# Patient Record
Sex: Female | Born: 1970 | Race: White | Hispanic: No | Marital: Single | Smoking: Never smoker
Health system: Southern US, Community
[De-identification: ages and names within clinical notes are randomized; demographics above are authoritative.]

## PROBLEM LIST (undated history)

## (undated) DIAGNOSIS — F329 Major depressive disorder, single episode, unspecified: Secondary | ICD-10-CM

## (undated) DIAGNOSIS — F419 Anxiety disorder, unspecified: Secondary | ICD-10-CM

## (undated) DIAGNOSIS — F32A Depression, unspecified: Secondary | ICD-10-CM

---

## 1998-04-19 ENCOUNTER — Emergency Department (HOSPITAL_COMMUNITY): Admission: EM | Admit: 1998-04-19 | Discharge: 1998-04-19 | Payer: Self-pay | Admitting: Emergency Medicine

## 1999-09-24 ENCOUNTER — Other Ambulatory Visit: Admission: RE | Admit: 1999-09-24 | Discharge: 1999-09-24 | Payer: Self-pay | Admitting: *Deleted

## 1999-09-24 ENCOUNTER — Encounter (INDEPENDENT_AMBULATORY_CARE_PROVIDER_SITE_OTHER): Payer: Self-pay | Admitting: Specialist

## 2001-05-05 ENCOUNTER — Encounter: Payer: Self-pay | Admitting: Emergency Medicine

## 2001-05-05 ENCOUNTER — Emergency Department (HOSPITAL_COMMUNITY): Admission: EM | Admit: 2001-05-05 | Discharge: 2001-05-05 | Payer: Self-pay | Admitting: *Deleted

## 2001-07-11 ENCOUNTER — Encounter: Payer: Self-pay | Admitting: Emergency Medicine

## 2001-07-11 ENCOUNTER — Inpatient Hospital Stay (HOSPITAL_COMMUNITY): Admission: EM | Admit: 2001-07-11 | Discharge: 2001-07-12 | Payer: Self-pay | Admitting: Emergency Medicine

## 2002-09-29 ENCOUNTER — Emergency Department (HOSPITAL_COMMUNITY): Admission: EM | Admit: 2002-09-29 | Discharge: 2002-09-29 | Payer: Self-pay | Admitting: Emergency Medicine

## 2003-01-24 ENCOUNTER — Emergency Department (HOSPITAL_COMMUNITY): Admission: EM | Admit: 2003-01-24 | Discharge: 2003-01-24 | Payer: Self-pay | Admitting: Emergency Medicine

## 2006-02-07 ENCOUNTER — Emergency Department (HOSPITAL_COMMUNITY): Admission: EM | Admit: 2006-02-07 | Discharge: 2006-02-07 | Payer: Self-pay | Admitting: Emergency Medicine

## 2007-04-02 ENCOUNTER — Emergency Department (HOSPITAL_COMMUNITY): Admission: EM | Admit: 2007-04-02 | Discharge: 2007-04-03 | Payer: Self-pay | Admitting: Emergency Medicine
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
## 2007-12-04 ENCOUNTER — Emergency Department (HOSPITAL_COMMUNITY): Admission: EM | Admit: 2007-12-04 | Discharge: 2007-12-04 | Payer: Self-pay | Admitting: Emergency Medicine

## 2008-03-08 ENCOUNTER — Inpatient Hospital Stay (HOSPITAL_COMMUNITY): Admission: AD | Admit: 2008-03-08 | Discharge: 2008-03-09 | Payer: Self-pay | Admitting: Obstetrics & Gynecology

## 2008-03-08 ENCOUNTER — Other Ambulatory Visit: Payer: Self-pay | Admitting: Family Medicine

## 2008-06-22 ENCOUNTER — Inpatient Hospital Stay (HOSPITAL_COMMUNITY): Admission: AD | Admit: 2008-06-22 | Discharge: 2008-06-22 | Payer: Self-pay | Admitting: Obstetrics & Gynecology

## 2008-06-22 ENCOUNTER — Ambulatory Visit: Payer: Self-pay | Admitting: Obstetrics and Gynecology

## 2008-06-22 ENCOUNTER — Encounter: Payer: Self-pay | Admitting: Obstetrics and Gynecology

## 2008-06-29 ENCOUNTER — Ambulatory Visit: Payer: Self-pay | Admitting: *Deleted

## 2008-07-20 ENCOUNTER — Ambulatory Visit: Payer: Self-pay | Admitting: Obstetrics & Gynecology

## 2008-08-10 ENCOUNTER — Ambulatory Visit: Payer: Self-pay | Admitting: Obstetrics & Gynecology

## 2008-08-24 ENCOUNTER — Ambulatory Visit: Payer: Self-pay | Admitting: Obstetrics & Gynecology

## 2008-08-31 ENCOUNTER — Ambulatory Visit: Payer: Self-pay | Admitting: Obstetrics & Gynecology

## 2008-09-07 ENCOUNTER — Ambulatory Visit: Payer: Self-pay | Admitting: Obstetrics & Gynecology

## 2008-09-14 ENCOUNTER — Ambulatory Visit: Payer: Self-pay | Admitting: Obstetrics & Gynecology

## 2008-09-21 ENCOUNTER — Ambulatory Visit: Payer: Self-pay | Admitting: Obstetrics & Gynecology

## 2008-09-21 ENCOUNTER — Ambulatory Visit (HOSPITAL_COMMUNITY): Admission: RE | Admit: 2008-09-21 | Discharge: 2008-09-21 | Payer: Self-pay | Admitting: Family Medicine

## 2008-09-23 ENCOUNTER — Inpatient Hospital Stay (HOSPITAL_COMMUNITY): Admission: RE | Admit: 2008-09-23 | Discharge: 2008-09-26 | Payer: Self-pay | Admitting: Obstetrics & Gynecology

## 2008-09-23 ENCOUNTER — Ambulatory Visit: Payer: Self-pay | Admitting: Physician Assistant

## 2009-10-19 IMAGING — US US OB COMP LESS 14 WK
1 series · 14 of 27 positions shown · non-contrast
Comparison: None.

CLINICAL DATA: Unknown accurate LMP, approximately 01/02/2008 (9
weeks 3 days), presenting with 2-week history of spotting.

OBSTETRIC <14 WK ULTRASOUND 03/08/2008:
TECHNIQUE: Transabdominal ultrasound was performed for evaluation
of the gestation as well as the maternal uterus and adnexal
regions.

[Series 1: us ob comp less 14 wks · 14 of 27 slices shown]
[im 1/27]
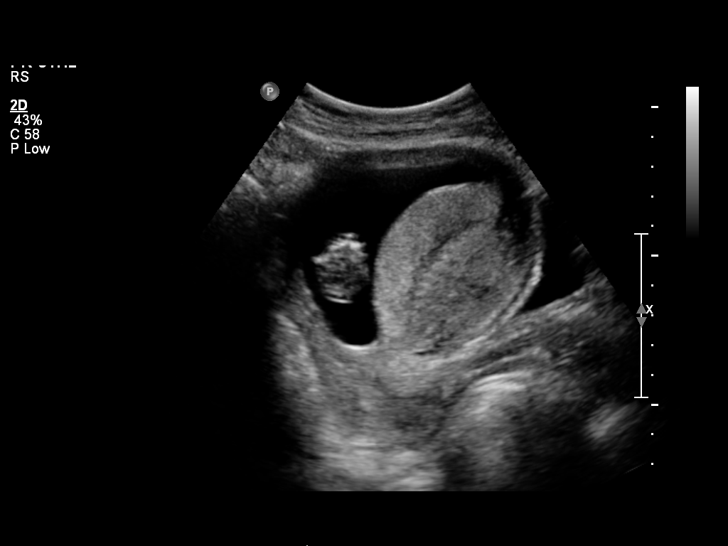
[im 3/27]
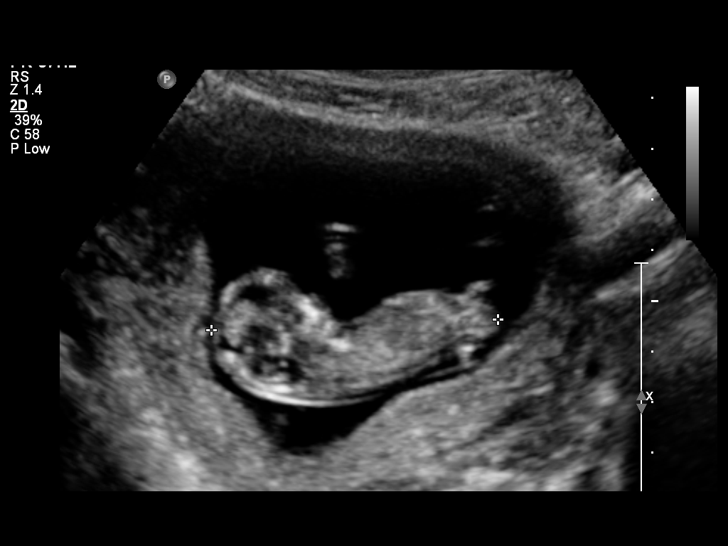
[im 5/27]
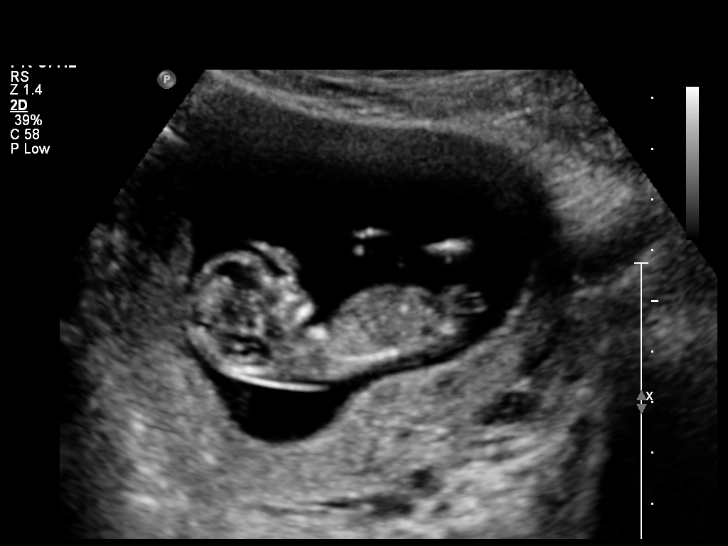
[im 7/27]
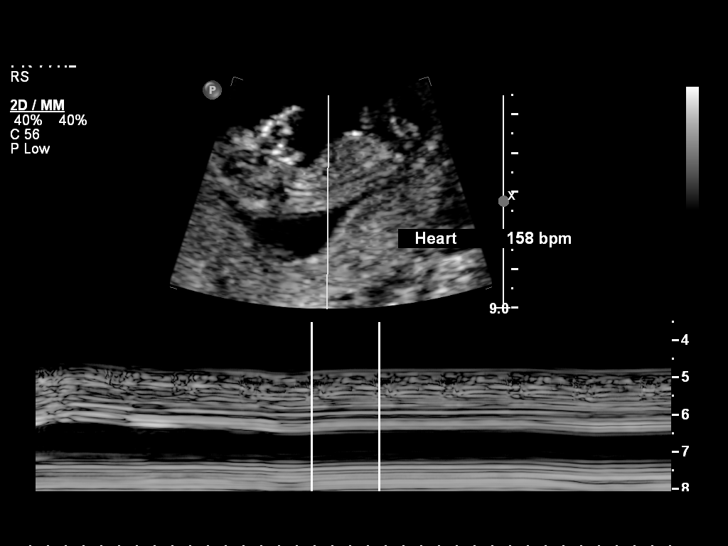
[im 9/27]
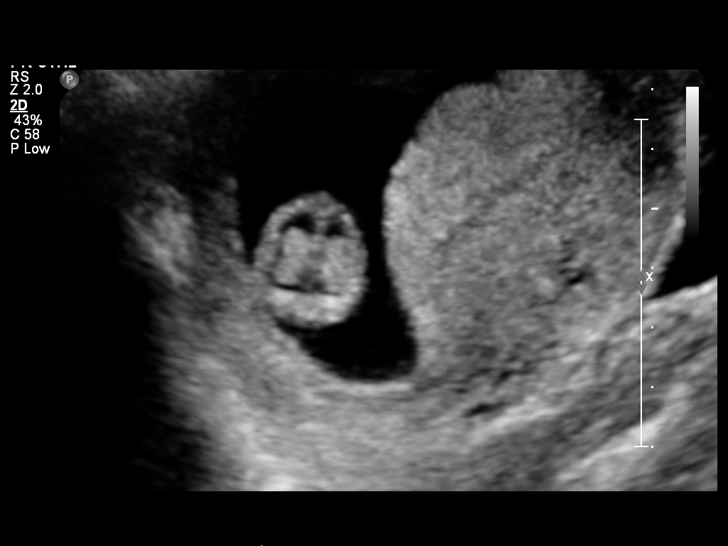
[im 11/27]
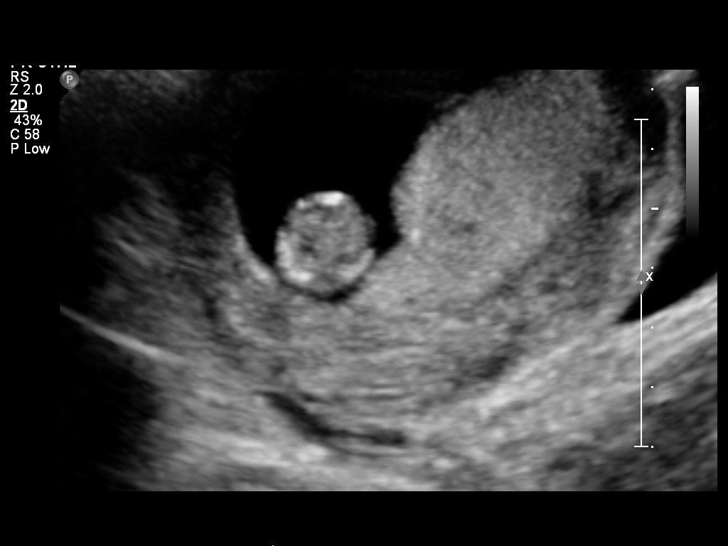
[im 13/27]
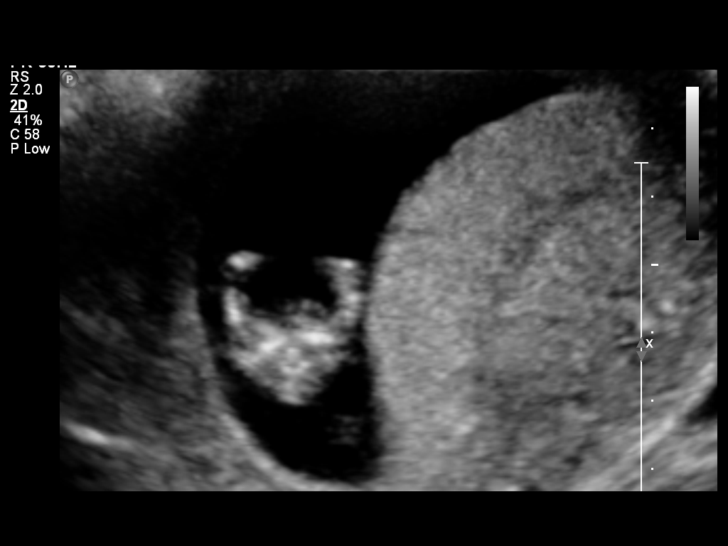
[im 15/27]
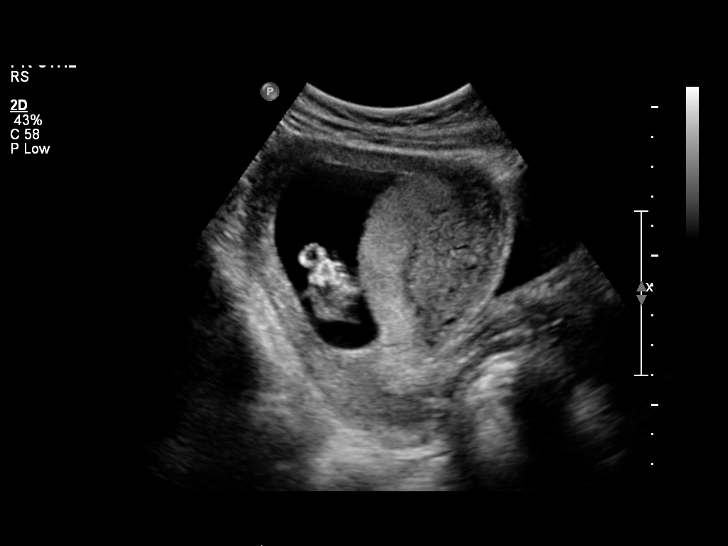
[im 17/27]
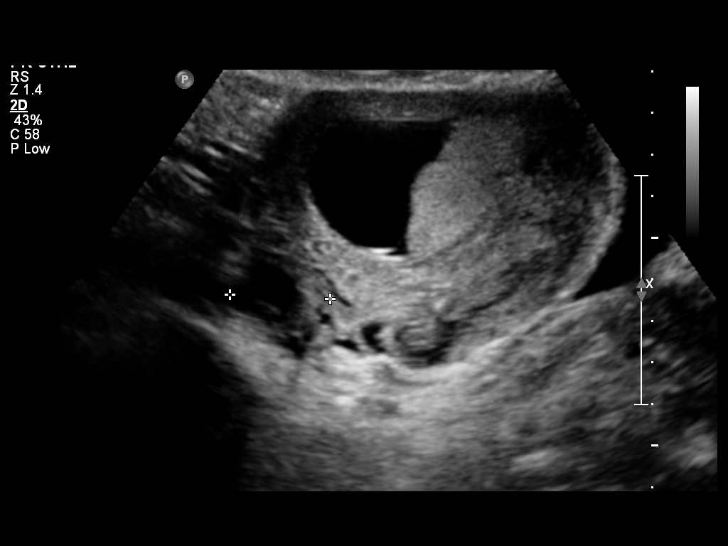
[im 19/27]
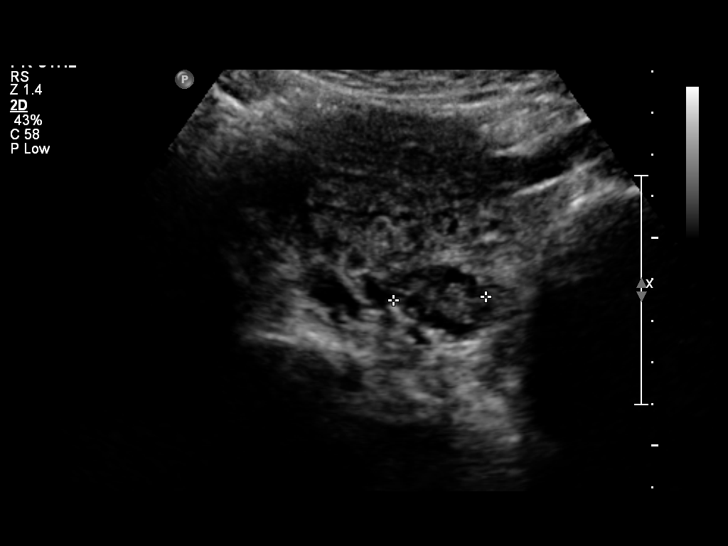
[im 21/27]
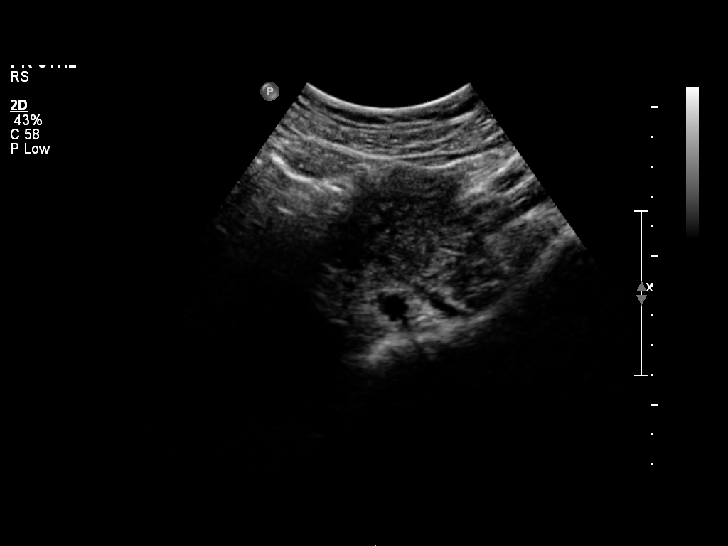
[im 23/27]
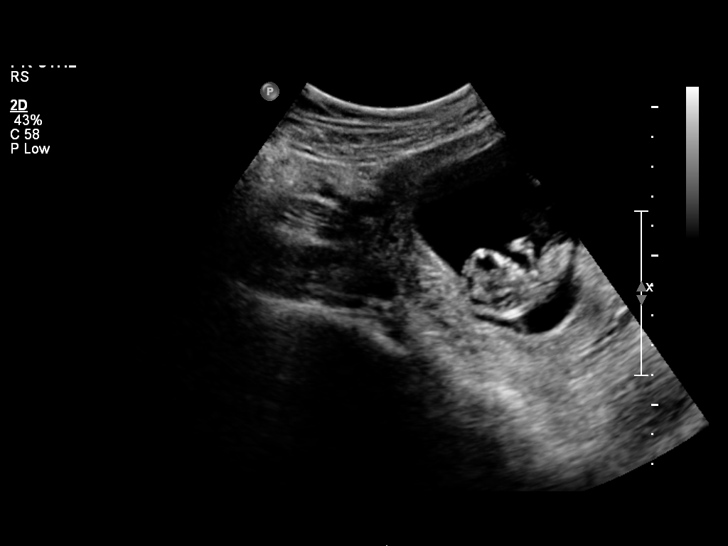
[im 25/27]
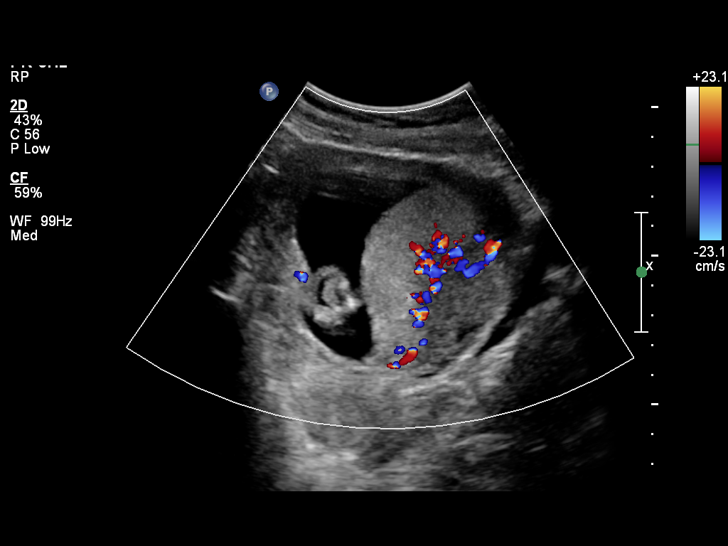
[im 27/27]
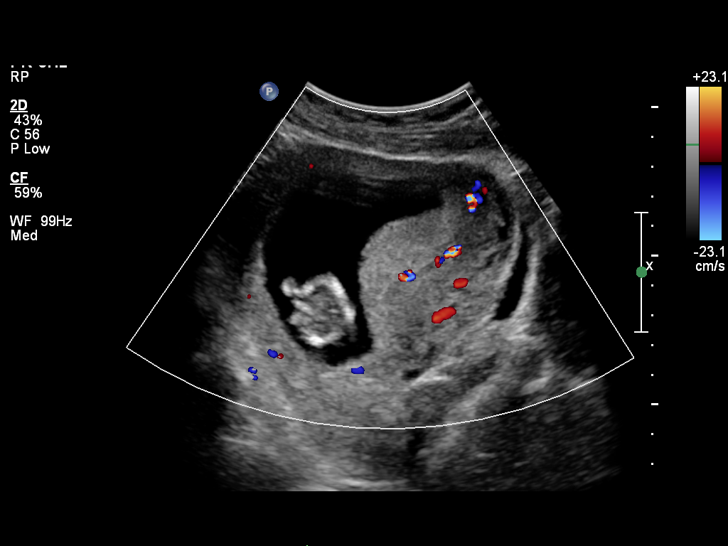

[14 of 27 positions shown; findings below may reference images not displayed]

Intrauterine gestational sac: Single.
Yolk sac: Visible.
Embryo: Visible.
Cardiac Activity: Visible.
Heart Rate: 158 bpm

CRL:  58 mm         12w  3d         US EDC: 09/17/2008

Maternal uterus/adnexae:
No evidence of subchorionic hemorrhage.  Both ovaries visualized
and both normal in appearance.  No adnexal masses or free fluid.
IMPRESSION: 1.  Single living intrauterine fetus with estimated gestational age
12 weeks 3 days by crown-rump length.  Ultrasound EDC 09/17/2008.
2.  No subchorionic hemorrhage.
3.  Normal ovaries.  No adnexal masses or free fluid.

## 2010-05-04 IMAGING — US US FETAL BPP W/O NONSTRESS
1 series · 14 of 15 positions shown · non-contrast
Comparison: none

OBSTETRICAL ULTRASOUND:
 This ultrasound exam was performed in the [HOSPITAL] Ultrasound Department.  The OB US report was generated in the AS system, and faxed to the ordering physician.  This report is also available in [REDACTED] PACS.

[Series 1: us fetal bpp w/o nonstress · 0.28mm/px · 15 acquisitions, 14 frames shown]
[im 1/15]
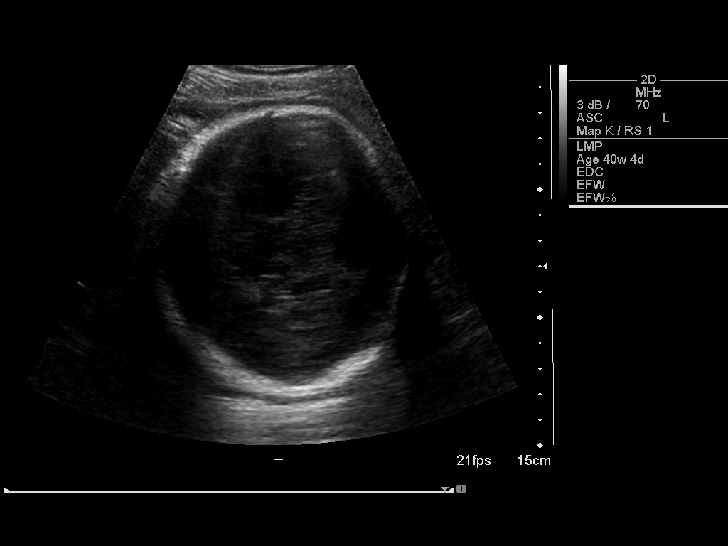
[im 2/15]
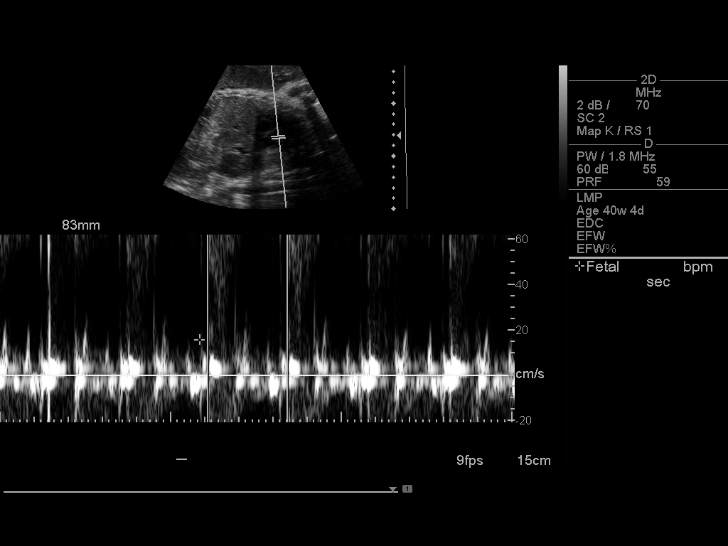
[im 3/15]
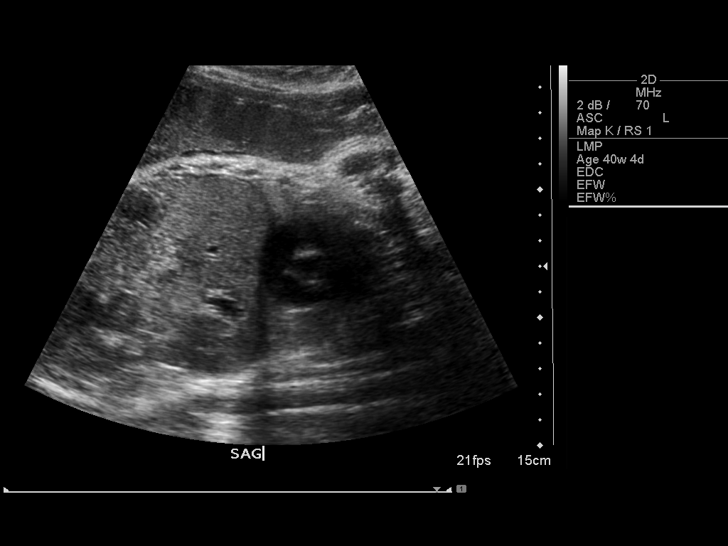
[im 4/15]
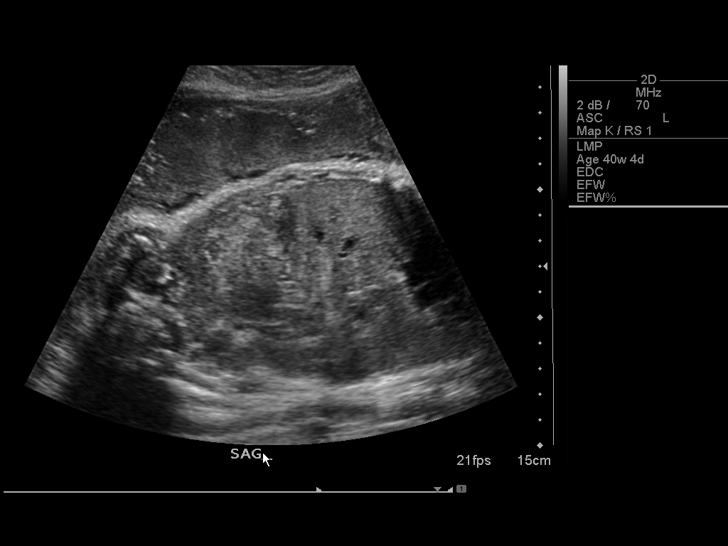
[im 5/15]
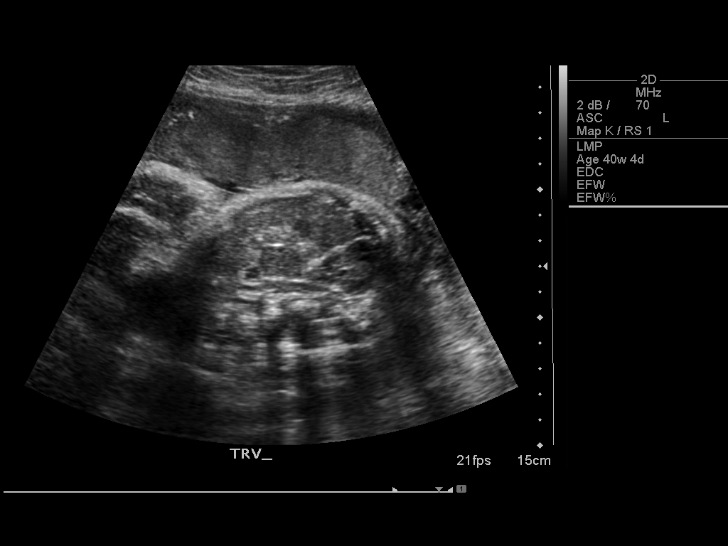
[im 6/15]
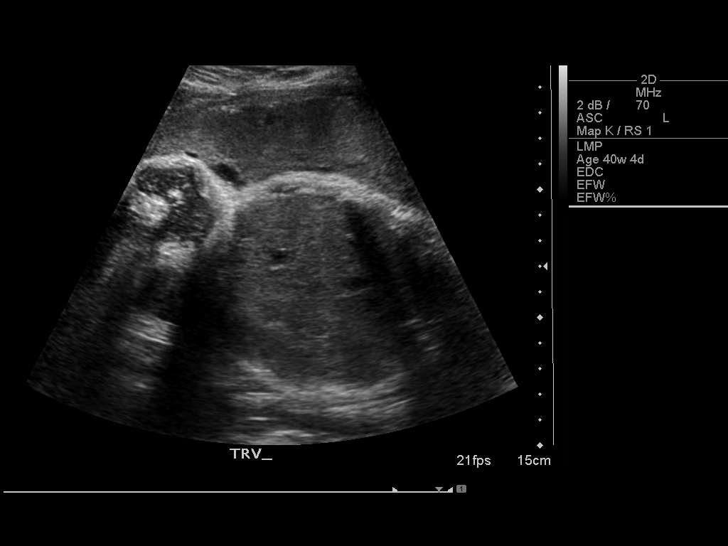
[im 7/15]
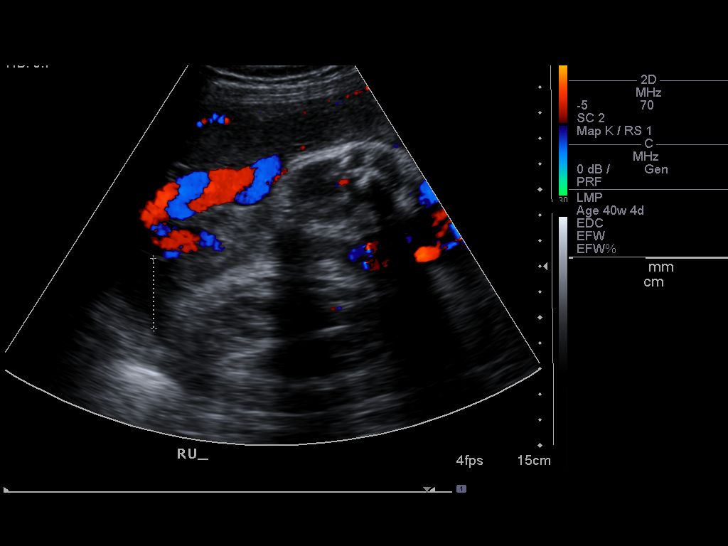
[im 9/15]
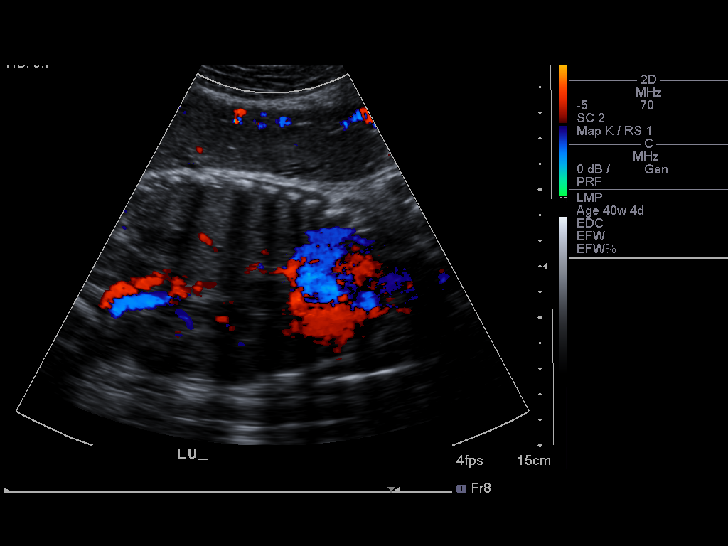
[im 10/15]
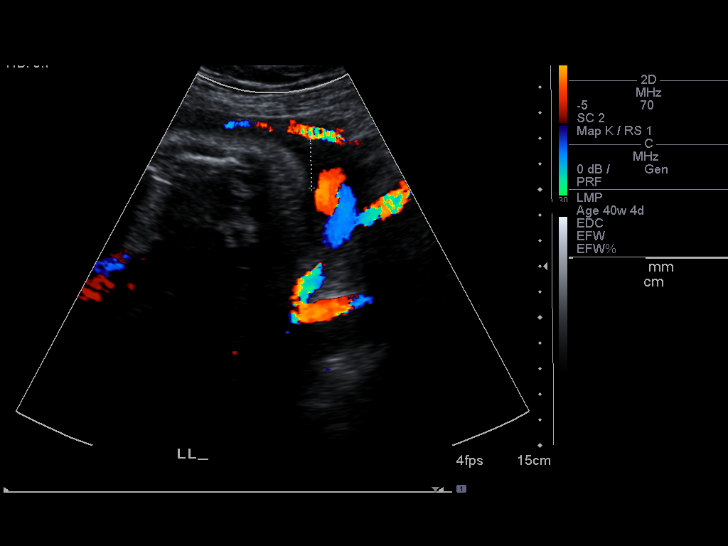
[im 11/15]
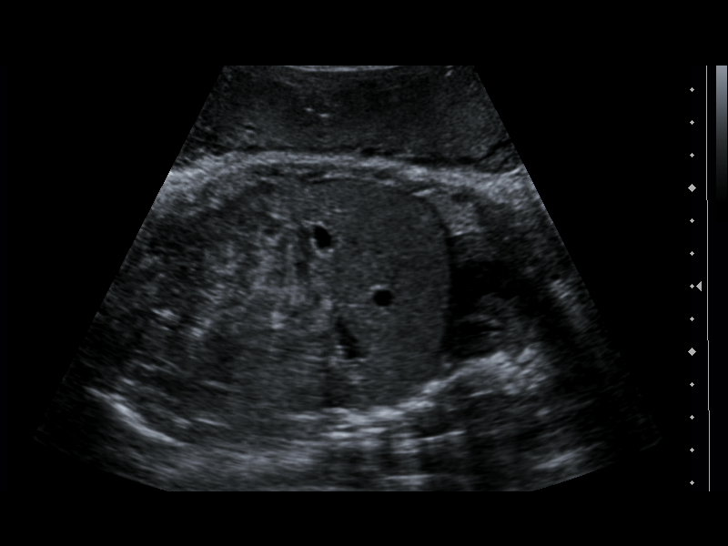
[im 12/15]
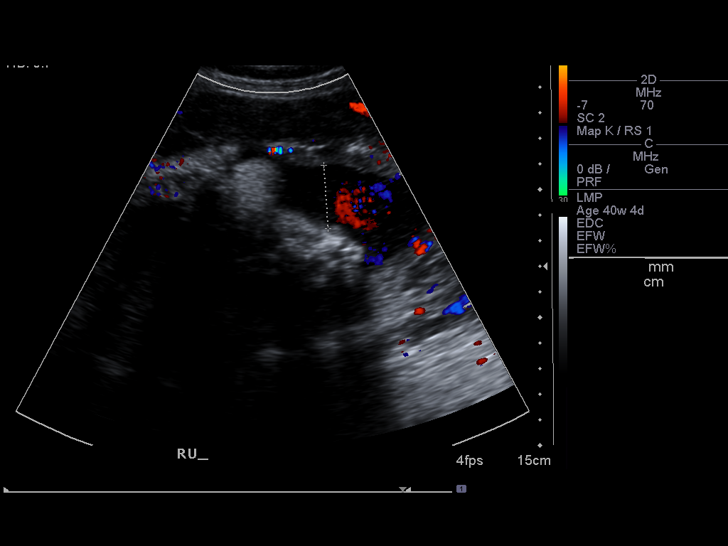
[im 13/15]
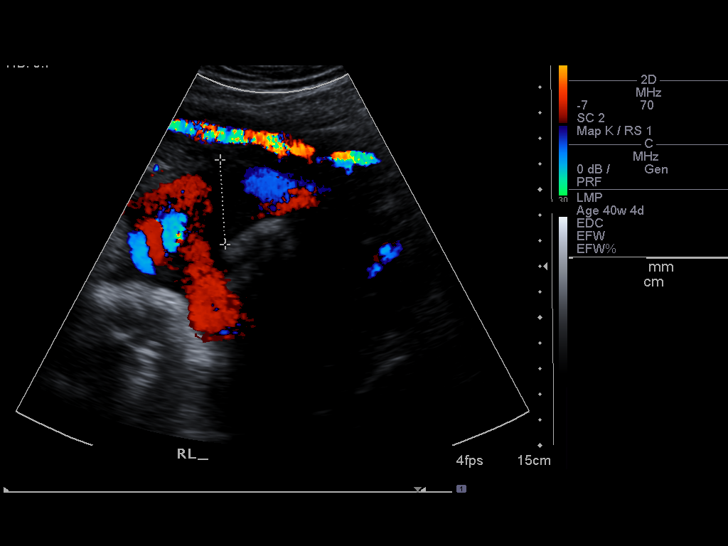
[im 14/15]
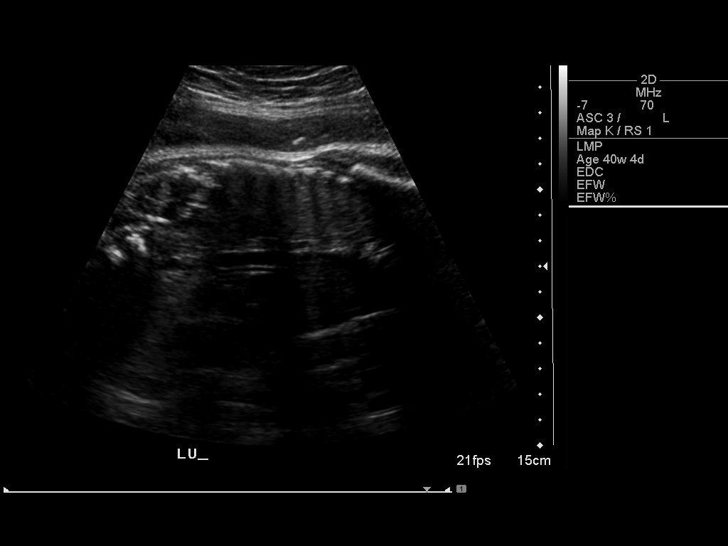
[im 15/15]
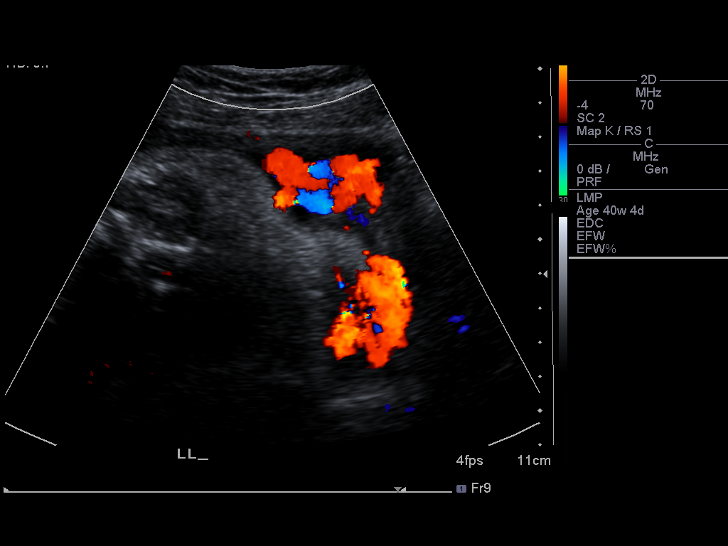

[14 of 15 positions shown; findings below may reference images not displayed]

IMPRESSION: See [facility] Obstetric US report.

## 2011-05-06 ENCOUNTER — Institutional Professional Consult (permissible substitution): Payer: Self-pay | Admitting: Medical

## 2011-07-11 ENCOUNTER — Encounter: Payer: Self-pay | Admitting: Family Medicine

## 2011-07-11 ENCOUNTER — Ambulatory Visit (INDEPENDENT_AMBULATORY_CARE_PROVIDER_SITE_OTHER): Payer: Medicaid Other | Admitting: Family Medicine

## 2011-07-11 VITALS — BP 114/70 | HR 85 | Wt 150.0 lb

## 2011-07-11 DIAGNOSIS — M549 Dorsalgia, unspecified: Secondary | ICD-10-CM

## 2011-07-11 MED ORDER — OXYCODONE-ACETAMINOPHEN 5-325 MG PO TABS: 1.0000 | ORAL_TABLET | Freq: Four times a day (QID) | ORAL | Status: AC | PRN

## 2011-07-11 NOTE — Progress Notes (Signed)
  Subjective:    Patient ID: Latoya Schaefer, female    DOB: [birth year], 40 y.o.   MRN: [id number]  HPI She is here for evaluation of back pain. She had a ruptured disc in 1999 from a work related injury and was seen by Dr. been. Apparently MRI scans did show 3 herniations. She was released by the orthopedic surgeon in 2000 and since then has had only minor troubles that she is him on her own. Approximately 7 days ago while doing nothing in particular she experienced back pain. She is having pain down both legs. She has tried Advil roughly 8 per day with no real relief. She says that Percocet helps her pain. Apparently the Vicodin is not successful.   Review of Systems     Objective:   Physical Exam Pain on motion of the back in all directions including rotational motion. Normal sensory and DTRs. She walks with a normal gait. Straight leg raising was questionably positive bilaterally at 60 but negative sciatic stretch.       Assessment & Plan:  Recurrence of back pain. I will give her Percocet. Discussed followup with her in regard to possible MRI and further interventions. At the end of the encounter she then asked for something for her anxiety. Review of her prescriptions through the state system shows that she is getting amphetamines and alprazolam on a regular basis. I did not tell her I do this but did tell her I would not get involved with the care for her

## 2011-07-15 ENCOUNTER — Telehealth: Payer: Self-pay

## 2011-07-15 ENCOUNTER — Encounter: Payer: Self-pay | Admitting: Family Medicine

## 2011-07-15 NOTE — Telephone Encounter (Signed)
Pt called and had slurring words she said med Dr.Lalonde gave her are not helping back pain is there any thing else he could give her

## 2011-07-15 NOTE — Telephone Encounter (Signed)
Called pt to let her know there is nothing stronger he could give her she said what should I do go to the ER I told her Im not for sure just theres nothing stronger he could give hrer

## 2011-07-15 NOTE — Telephone Encounter (Signed)
Left ear no that there  is nothing stronger than I can give her for her back.

## 2011-07-23 ENCOUNTER — Ambulatory Visit: Payer: Medicaid Other | Admitting: Family Medicine

## 2011-08-27 LAB — GC/CHLAMYDIA PROBE AMP, GENITAL: Chlamydia, DNA Probe: NEGATIVE

## 2011-08-27 LAB — WET PREP, GENITAL
Clue Cells Wet Prep HPF POC: NONE SEEN
Yeast Wet Prep HPF POC: NONE SEEN

## 2011-08-30 LAB — WET PREP, GENITAL
Clue Cells Wet Prep HPF POC: NONE SEEN
Trich, Wet Prep: NONE SEEN
Yeast Wet Prep HPF POC: NONE SEEN

## 2011-08-30 LAB — POCT URINALYSIS DIP (DEVICE)
Bilirubin Urine: NEGATIVE
Glucose, UA: NEGATIVE
Hgb urine dipstick: NEGATIVE
Ketones, ur: NEGATIVE
Operator id: 120861
Protein, ur: NEGATIVE
Urobilinogen, UA: 0.2

## 2011-08-30 LAB — FETAL FIBRONECTIN: Fetal Fibronectin: NEGATIVE

## 2011-08-30 LAB — RAPID URINE DRUG SCREEN, HOSP PERFORMED
Amphetamines: NOT DETECTED
Cocaine: NOT DETECTED

## 2011-08-30 LAB — HIV ANTIBODY (ROUTINE TESTING W REFLEX): HIV: NONREACTIVE

## 2011-09-02 LAB — POCT URINALYSIS DIP (DEVICE)
Bilirubin Urine: NEGATIVE
Bilirubin Urine: NEGATIVE
Bilirubin Urine: NEGATIVE
Glucose, UA: NEGATIVE
Hgb urine dipstick: NEGATIVE
Hgb urine dipstick: NEGATIVE
Ketones, ur: NEGATIVE
Ketones, ur: NEGATIVE
Nitrite: NEGATIVE
Nitrite: NEGATIVE
Nitrite: NEGATIVE
Operator id: 297281
Operator id: 194561
Operator id: 287931
Protein, ur: 30 — AB
Protein, ur: 30 — AB
Protein, ur: NEGATIVE
Specific Gravity, Urine: 1.015
Specific Gravity, Urine: 1.025
Specific Gravity, Urine: 1.025
Urobilinogen, UA: 0.2
Urobilinogen, UA: 1
Urobilinogen, UA: 1
pH: 6.5
pH: 5.5
pH: 6
pH: 6

## 2011-09-02 LAB — CBC
HCT: 41.4
MCHC: 33.5
MCV: 89.1
MCV: 90.6
Platelets: 146 — ABNORMAL LOW
RBC: 4.64
RDW: 13.4
RDW: 13.7

## 2011-09-02 LAB — RPR: RPR Ser Ql: NONREACTIVE

## 2011-09-04 LAB — POCT URINALYSIS DIP (DEVICE)
Hgb urine dipstick: NEGATIVE
Ketones, ur: NEGATIVE
Nitrite: NEGATIVE
Protein, ur: NEGATIVE
pH: 5.5

## 2012-01-30 ENCOUNTER — Other Ambulatory Visit: Payer: Self-pay | Admitting: Neurosurgery

## 2012-01-30 DIAGNOSIS — M545 Low back pain: Secondary | ICD-10-CM

## 2012-02-04 ENCOUNTER — Inpatient Hospital Stay: Admission: RE | Admit: 2012-02-04 | Payer: Medicaid Other | Source: Ambulatory Visit

## 2012-02-22 ENCOUNTER — Inpatient Hospital Stay: Admission: RE | Admit: 2012-02-22 | Payer: Medicaid Other | Source: Ambulatory Visit

## 2012-02-26 ENCOUNTER — Inpatient Hospital Stay: Admission: RE | Admit: 2012-02-26 | Payer: Medicaid Other | Source: Ambulatory Visit

## 2012-03-06 ENCOUNTER — Inpatient Hospital Stay: Admission: RE | Admit: 2012-03-06 | Payer: Medicaid Other | Source: Ambulatory Visit

## 2016-03-15 ENCOUNTER — Emergency Department (HOSPITAL_COMMUNITY)
Admission: EM | Admit: 2016-03-15 | Discharge: 2016-03-16 | Disposition: A | Discharge status: Home / Self Care | Payer: Medicaid Other | Attending: Emergency Medicine | Admitting: Emergency Medicine

## 2016-03-15 ENCOUNTER — Encounter (HOSPITAL_COMMUNITY): Payer: Self-pay | Admitting: Emergency Medicine

## 2016-03-15 DIAGNOSIS — K029 Dental caries, unspecified: Secondary | ICD-10-CM | POA: Insufficient documentation

## 2016-03-15 DIAGNOSIS — K047 Periapical abscess without sinus: Secondary | ICD-10-CM | POA: Diagnosis not present

## 2016-03-15 DIAGNOSIS — Z8659 Personal history of other mental and behavioral disorders: Secondary | ICD-10-CM | POA: Diagnosis not present

## 2016-03-15 DIAGNOSIS — K0889 Other specified disorders of teeth and supporting structures: Secondary | ICD-10-CM

## 2016-03-15 HISTORY — DX: Major depressive disorder, single episode, unspecified: F32.9

## 2016-03-15 HISTORY — DX: Anxiety disorder, unspecified: F41.9

## 2016-03-15 HISTORY — DX: Depression, unspecified: F32.A

## 2016-03-15 MED ORDER — IBUPROFEN 800 MG PO TABS: 800.0000 mg | ORAL_TABLET | Freq: Three times a day (TID) | ORAL | Status: DC

## 2016-03-15 MED ORDER — CLINDAMYCIN HCL 150 MG PO CAPS: 450.0000 mg | ORAL_CAPSULE | Freq: Three times a day (TID) | ORAL | Status: DC

## 2016-03-15 NOTE — ED Notes (Signed)
Pt. reports left lower molar pain with swelling onset last month .

## 2016-03-15 NOTE — ED Provider Notes (Signed)
CSN: [id number]     Arrival date & time 03/15/16  2255 History  By signing my name below, I, Placido Sou, attest that this documentation has been prepared under the direction and in the presence of [employer], PA-C. Electronically Signed: Placido Sou, ED Scribe. 03/15/2016. 11:52 PM.   Chief Complaint  Patient presents with  . Dental Pain   The history is provided by the patient. No language interpreter was used.   HPI Comments: Latoya Schaefer is a 45 y.o. female with a PMHx of dental pain who presents to the Emergency Department complaining of waxing and waning, moderate, left lower dental pain onset 1 month ago. She states that she cracked a tooth in the infected region 1 month ago prior to the onset of her symptoms. Pt reports worsening swelling in the affected region. Her pain worsens with palpation. She reports having taken ~8 clindamycin from a prior rx without significant relief of her symptoms. She denies a PMHx of DM. Pt confirms having a dental provider. Pt denies difficulty swallowing, vomiting, fevers, chills, neck stiffness, or any other associated symptoms at this time.    Past Medical History  Diagnosis Date  . Depression   . Anxiety    History reviewed. No pertinent past surgical history. No family history on file. Social History  Substance Use Topics  . Smoking status: Never Smoker   . Smokeless tobacco: Never Used  . Alcohol Use: No   OB History    No data available     Review of Systems  Constitutional: Negative for fever and chills.  HENT: Positive for dental problem and facial swelling. Negative for trouble swallowing.   Gastrointestinal: Positive for nausea. Negative for vomiting.    Allergies  Review of patient's allergies indicates no known allergies.  Home Medications   Prior to Admission medications   Not on File   BP 115/71 mmHg  Pulse 96  Temp(Src) 97.3 F (36.3 C) (Oral)  Resp 16  Ht  (1.727 m)  Wt 145 lb (65.772 kg)  BMI  22.05 kg/m2  SpO2 100%  LMP 02/25/2016    Physical Exam  Constitutional: She is oriented to person, place, and time. She appears well-developed and well-nourished.  HENT:  Head: Normocephalic and atraumatic.  Mouth/Throat: Uvula is midline. No trismus in the jaw. Dental abscesses and dental caries present. No uvula swelling.    TTP and swelling of the left lower gingiva; no sublingual tenderness or swelling; no trismus Left sided facial swelling in the area of the affected tooth  Eyes: EOM are normal.  Neck: Normal range of motion.  Cardiovascular: Normal rate.   Pulmonary/Chest: Effort normal. No respiratory distress.  Abdominal: Soft.  Musculoskeletal: Normal range of motion.  Neurological: She is alert and oriented to person, place, and time.  Skin: Skin is warm and dry.  Psychiatric: She has a normal mood and affect.  Nursing note and vitals reviewed.   ED Course  Procedures  DIAGNOSTIC STUDIES: Oxygen Saturation is 100% on RA, normal by my interpretation.    COORDINATION OF CARE: 11:48 PM Discussed next steps with pt. Return precautions noted. She verbalized understanding and is agreeable with the plan.   Labs Review Labs Reviewed - No data to display  Imaging Review No results found.   EKG Interpretation None      MDM   Final diagnoses:  None   Patient with dentalgia.  Abscess palpated deep in the gingiva, but does not extend to the neck or  submandibular/submental space.  Patient declined attempts to incise and drain.  No sublingual tenderness or swelling.  Exam not concerning for Ludwig's angina or pharyngeal abscess.  Will treat with clindamycin. Pt instructed to follow-up with dentist.  Strict return precautions given. Pt safe for discharge.  I personally performed the services described in this documentation, which was scribed in my presence. The recorded information has been reviewed and is accurate.    Santiago GladHeather Indra Wolters, PA-C 03/16/16 [id number]  Tomasita CrumbleAdeleke  Oni, MD 03/16/16 1600

## 2016-03-15 NOTE — ED Notes (Signed)
PA-C to see and assess patient before RN assessment. See PA note. 

## 2016-03-16 NOTE — ED Notes (Signed)
Patient verbalized understanding of discharge instructions and denies any further needs or questions at this time. VS stable. Patient ambulatory with steady gait.  

## 2017-03-04 ENCOUNTER — Emergency Department (HOSPITAL_COMMUNITY)
Admission: EM | Admit: 2017-03-04 | Discharge: 2017-03-04 | Disposition: A | Discharge status: Home / Self Care | Payer: Medicaid Other | Attending: Emergency Medicine | Admitting: Emergency Medicine

## 2017-03-04 ENCOUNTER — Encounter (HOSPITAL_COMMUNITY): Payer: Self-pay | Admitting: Emergency Medicine

## 2017-03-04 DIAGNOSIS — L02412 Cutaneous abscess of left axilla: Secondary | ICD-10-CM | POA: Diagnosis present

## 2017-03-04 DIAGNOSIS — Z79899 Other long term (current) drug therapy: Secondary | ICD-10-CM | POA: Insufficient documentation

## 2017-03-04 MED ORDER — SULFAMETHOXAZOLE-TRIMETHOPRIM 800-160 MG PO TABS: 1.0000 | ORAL_TABLET | Freq: Two times a day (BID) | ORAL | 0 refills | Status: DC

## 2017-03-04 MED ORDER — IBUPROFEN 800 MG PO TABS: 800.0000 mg | ORAL_TABLET | Freq: Three times a day (TID) | ORAL | 0 refills | Status: DC

## 2017-03-04 NOTE — ED Triage Notes (Signed)
Pt reports she began to have an abscess under her L armpit several days ago. Since then several others have appeared around original abscess.

## 2017-03-04 NOTE — Discharge Instructions (Signed)
Read the information below.  Use the prescribed medication as directed.  Please discuss all new medications with your pharmacist.  You may return to the Emergency Department at any time for worsening condition or any new symptoms that concern you.     If you develop increased redness, swelling, increased pain, or fevers greater than 100.4, return to the ER immediately for a recheck.    Use warm moist compresses on the area to encourage drainage.

## 2017-03-04 NOTE — ED Provider Notes (Signed)
[facility]-EMERGENCY DEPT Provider Note   CSN: [id number] Arrival date & time: 03/04/17  1807  By signing my name below, I, Latoya Schaefer, attest that this documentation has been prepared under the direction and in the presence of [facility], PA-C.  Electronically Signed: Rosario Schaefer, ED Scribe. 03/04/17. 6:48 PM.  History   Chief Complaint Chief Complaint  Patient presents with  . Abscess   The history is provided by the patient. No language interpreter was used.    HPI Comments: Latoya Schaefer is a 46 y.o. female who presents to the Emergency Department complaining of multiple moderate, gradually worsening areas of pain and swelling to the left axilla onset 1.5 weeks ago. Per pt, she initially only had one area of pain and swelling to the area, but it has since worsened with several similar areas surrounding it. She notes that prior to the onset of her symptoms that she did shave the area. Pt states pain is exacerbated with palpation and direct pressure. She has been applying warm compresses to the area without relief. Pt has previously had similar abscesses to the area. Denies fever, chills, generalized myalgias, weakness, numbness, or any other associated symptoms.   Past Medical History:  Diagnosis Date  . Anxiety   . Depression    There are no active problems to display for this patient.  History reviewed. No pertinent surgical history.  OB History    No data available     Home Medications    Prior to Admission medications   Medication Sig Start Date End Date Taking? Authorizing Provider  ibuprofen (ADVIL,MOTRIN) 800 MG tablet Take 1 tablet (800 mg total) by mouth 3 (three) times daily. 03/04/17   Trixie Dredge, PA-C  sulfamethoxazole-trimethoprim (BACTRIM DS,SEPTRA DS) 800-160 MG tablet Take 1 tablet by mouth 2 (two) times daily. X 7 days 03/04/17   Trixie Dredge, PA-C   Family History History reviewed. No pertinent family history.  Social History Social History    Substance Use Topics  . Smoking status: Never Smoker  . Smokeless tobacco: Never Used  . Alcohol use No   Allergies   Patient has no known allergies.  Review of Systems Review of Systems  Constitutional: Negative for chills and fever.  Musculoskeletal: Negative for myalgias.  Skin: Positive for wound.  Neurological: Negative for weakness and numbness.   Physical Exam Updated Vital Signs BP 114/79   Pulse (!) 103   Temp 97.8 F (36.6 C) (Oral)   Resp 16   SpO2 100%   Physical Exam  Constitutional: She appears well-developed and well-nourished.  HENT:  Head: Normocephalic and atraumatic.  Neck: Neck supple.  Pulmonary/Chest: Effort normal.  Neurological: She is alert.  Skin: Skin is warm and dry. There is erythema.  Left axilla with multiple area of erythema and induration. No fluctuance, no drainage.   Nursing note and vitals reviewed.  ED Treatments / Results  DIAGNOSTIC STUDIES: Oxygen Saturation is 100% on RA, normal by my interpretation.   COORDINATION OF CARE: 6:47 PM-Discussed next steps with pt. Pt verbalized understanding and is agreeable with the plan.   Labs (all labs ordered are listed, but only abnormal results are displayed) Labs Reviewed - No data to display  EKG  EKG Interpretation None      Radiology No results found.  Procedures Procedures   Medications Ordered in ED Medications - No data to display  Initial Impression / Assessment and Plan / ED Course  I have reviewed the triage vital signs  and the nursing notes.  Pertinent labs & imaging results that were available during my care of the patient were reviewed by me and considered in my medical decision making (see chart for details).     Afebrile, nontoxic patient with several small areas of cellulitis, likely forming abscesses of the left axilla.  None appear amenable to I&D at this time and pt does not currently want I&D.  Will do trial of antibiotics and warm moist  compresses.  Pt advised she may need to return for I&D, discussed return precautions.  D/C home with bactrim, motrin.  Discussed result, findings, treatment, and follow up  with patient.  Pt given return precautions.  Pt verbalizes understanding and agrees with plan.       Final Clinical Impressions(s) / ED Diagnoses   Final diagnoses:  Abscess of left axilla   New Prescriptions Discharge Medication List as of 03/04/2017  6:54 PM    START taking these medications   Details  sulfamethoxazole-trimethoprim (BACTRIM DS,SEPTRA DS) 800-160 MG tablet Take 1 tablet by mouth 2 (two) times daily. X 7 days, Starting Tue 03/04/2017, Print       I personally performed the services described in this documentation, which was scribed in my presence. The recorded information has been reviewed and is accurate.     Trixie Dredge, PA-C 03/04/17 1938    Doug Sou, MD 03/04/17 2352

## 2018-09-09 ENCOUNTER — Encounter (HOSPITAL_COMMUNITY): Payer: Self-pay | Admitting: Emergency Medicine

## 2018-09-09 ENCOUNTER — Emergency Department (HOSPITAL_COMMUNITY)
Admission: EM | Admit: 2018-09-09 | Discharge: 2018-09-10 | Disposition: A | Discharge status: Home / Self Care | Payer: Medicaid Other | Attending: Emergency Medicine | Admitting: Emergency Medicine

## 2018-09-09 ENCOUNTER — Other Ambulatory Visit: Payer: Self-pay

## 2018-09-09 DIAGNOSIS — Z79899 Other long term (current) drug therapy: Secondary | ICD-10-CM | POA: Insufficient documentation

## 2018-09-09 DIAGNOSIS — Z0441 Encounter for examination and observation following alleged adult rape: Secondary | ICD-10-CM | POA: Diagnosis not present
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
## 2018-09-09 NOTE — ED Notes (Signed)
Food given to patient.  
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
## 2018-09-09 NOTE — ED Notes (Signed)
ED Provider at bedside. 

## 2018-09-09 NOTE — ED Notes (Signed)
Pt parents number: Bonita Quin & Gretel Cantu [phone]  Pts parents currently en route to the ER but coming from [employer] per [employer].

## 2018-09-10 ENCOUNTER — Ambulatory Visit (HOSPITAL_COMMUNITY)
Admission: EM | Admit: 2018-09-10 | Discharge: 2018-09-10 | Disposition: A | Discharge status: Home / Self Care | Payer: No Typology Code available for payment source | Attending: Emergency Medicine | Admitting: Emergency Medicine

## 2018-09-10 DIAGNOSIS — Z0441 Encounter for examination and observation following alleged adult rape: Secondary | ICD-10-CM | POA: Insufficient documentation

## 2018-09-10 LAB — URINALYSIS, ROUTINE W REFLEX MICROSCOPIC
Bilirubin Urine: NEGATIVE
Glucose, UA: NEGATIVE mg/dL
Hgb urine dipstick: NEGATIVE
Nitrite: NEGATIVE
PH: 6 (ref 5.0–8.0)
Protein, ur: NEGATIVE mg/dL
SPECIFIC GRAVITY, URINE: 1.025 (ref 1.005–1.030)

## 2018-09-10 LAB — RAPID URINE DRUG SCREEN, HOSP PERFORMED
Amphetamines: POSITIVE — AB
Barbiturates: NOT DETECTED
Benzodiazepines: POSITIVE — AB
Cocaine: NOT DETECTED
OPIATES: NOT DETECTED
TETRAHYDROCANNABINOL: NOT DETECTED

## 2018-09-10 LAB — POC URINE PREG, ED: Preg Test, Ur: NEGATIVE

## 2018-09-10 LAB — URINALYSIS, MICROSCOPIC (REFLEX): RBC / HPF: NONE SEEN RBC/hpf (ref 0–5)

## 2018-09-10 MED ORDER — LIDOCAINE HCL (PF) 1 % IJ SOLN
0.9000 mL | Freq: Once | INTRAMUSCULAR | Status: AC
  Administered 2018-09-10: 0.9 mL
  Filled 2018-09-10: qty 2

## 2018-09-10 MED ORDER — CEFTRIAXONE SODIUM 250 MG IJ SOLR
250.0000 mg | Freq: Once | INTRAMUSCULAR | Status: AC
  Administered 2018-09-10: 250 mg via INTRAMUSCULAR
  Filled 2018-09-10: qty 250

## 2018-09-10 MED ORDER — ALPRAZOLAM 0.5 MG PO TABS
1.0000 mg | ORAL_TABLET | Freq: Once | ORAL | Status: AC
  Administered 2018-09-10: 1 mg via ORAL

## 2018-09-10 MED ORDER — AZITHROMYCIN 250 MG PO TABS
1000.0000 mg | ORAL_TABLET | Freq: Once | ORAL | Status: AC
  Administered 2018-09-10: 1000 mg via ORAL
  Filled 2018-09-10: qty 4

## 2018-09-10 MED ORDER — METRONIDAZOLE 500 MG PO TABS
2000.0000 mg | ORAL_TABLET | Freq: Once | ORAL | Status: AC
  Administered 2018-09-10: 2000 mg via ORAL
  Filled 2018-09-10: qty 4

## 2018-09-10 MED ORDER — ALPRAZOLAM 0.5 MG PO TABS
ORAL_TABLET | ORAL | Status: AC
  Administered 2018-09-10: 1 mg via ORAL
  Filled 2018-09-10: qty 2
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
## 2018-09-10 NOTE — ED Notes (Signed)
Sane nurse in room
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
## 2024-02-27 ENCOUNTER — Telehealth: Admitting: Physician Assistant

## 2024-02-27 DIAGNOSIS — L732 Hidradenitis suppurativa: Secondary | ICD-10-CM

## 2024-02-27 MED ORDER — CLINDAMYCIN HCL 300 MG PO CAPS: 300.0000 mg | ORAL_CAPSULE | Freq: Three times a day (TID) | ORAL | 0 refills | Status: AC

## 2024-02-27 NOTE — Progress Notes (Signed)
 I have spent 5 minutes in review of e-visit questionnaire, review and updating patient chart, medical decision making and response to patient.   Piedad Climes, PA-C

## 2024-02-27 NOTE — Progress Notes (Signed)
 E-Visit for Cellulitis  We are sorry that you are not feeling well. Here is how we plan to help!  Based on what you shared with me it looks like you have cellulitis.  Cellulitis looks like areas of skin redness, swelling, and warmth; it develops as a result of bacteria entering under the skin. Little red spots and/or bleeding can be seen in skin, and tiny surface sacs containing fluid can occur. Fever can be present. Cellulitis is almost always on one side of a body, and the lower limbs are the most common site of involvement.   I have prescribed:  Clindamycin 300 mg take one by mouth three times a day for 5 days  HOME CARE:  Take your medications as ordered and take all of them, even if the skin irritation appears to be healing.   GET HELP RIGHT AWAY IF:  Symptoms that don't begin to go away within 48 hours. Severe redness persists or worsens If the area turns color, spreads or swells. If it blisters and opens, develops yellow-brown crust or bleeds. You develop a fever or chills. If the pain increases or becomes unbearable.  Are unable to keep fluids and food down.  MAKE SURE YOU   Understand these instructions. Will watch your condition. Will get help right away if you are not doing well or get worse.  Thank you for choosing an e-visit.  Your e-visit answers were reviewed by a board certified advanced clinical practitioner to complete your personal care plan. Depending upon the condition, your plan could have included both over the counter or prescription medications.  Please review your pharmacy choice. Make sure the pharmacy is open so you can pick up prescription now. If there is a problem, you may contact your provider through [employer] and have the prescription routed to another pharmacy.  Your safety is important to [place]. If you have drug allergies check your prescription carefully.   For the next 24 hours you can use MyChart to ask questions about today's visit,  request a non-urgent call back, or ask for a work or school excuse. You will get an email in the next two days asking about your experience. I hope that your e-visit has been valuable and will speed your recovery.

## 2024-02-28 ENCOUNTER — Encounter: Payer: Self-pay | Admitting: Physician Assistant

## 2024-02-28 NOTE — Progress Notes (Signed)
 Good morning,  You had an evisit yesterday for this and the provider did send an antibiotic to your pharmacy for you.   You will not be charged for this visit.

## 2024-03-03 ENCOUNTER — Ambulatory Visit

## 2024-03-03 ENCOUNTER — Telehealth: Payer: Self-pay | Admitting: Emergency Medicine

## 2024-04-08 ENCOUNTER — Ambulatory Visit: Admitting: Internal Medicine

## 2024-04-08 NOTE — Progress Notes (Deleted)
  [facility] [address] [place] [address] Dept: [phone] Dept Fax: [phone]  New Patient Office Visit  Subjective:   Latoya Schaefer 29-May-1971 04/08/2024  No chief complaint on file.   HPI: Latoya Schaefer presents today to establish care at [facility]. Introduced to Publishing rights manager role and practice setting.  All questions answered.  Concerns: See below       The following portions of the patient's history were reviewed and updated as appropriate: past medical history, past surgical history, family history, social history, allergies, medications, and problem list.   There are no active problems to display for this patient.  Past Medical History:  Diagnosis Date   Anxiety    Depression    No past surgical history on file. No family history on file.  Current Outpatient Medications:    alprazolam  (XANAX ) 2 MG tablet, Take 2 mg by mouth 2 (two) times daily., Disp: , Rfl:    amphetamine-dextroamphetamine (ADDERALL) 20 MG tablet, Take 10-20 mg by mouth 4 (four) times daily as needed (for ADD and narcolepsy)., Disp: , Rfl:    escitalopram (LEXAPRO) 20 MG tablet, Take 20-40 mg by mouth daily., Disp: , Rfl:    QUEtiapine (SEROQUEL XR) 300 MG 24 hr tablet, Take 300 mg by mouth at bedtime., Disp: , Rfl:  No Known Allergies  ROS: A complete ROS was performed with pertinent positives/negatives noted in the HPI. The remainder of the ROS are negative.   Objective:   There were no vitals filed for this visit.  GENERAL: Well-appearing, in NAD. Well nourished.  SKIN: Pink, warm and dry. No rash, lesion, ulceration, or ecchymoses.  NECK: Trachea midline. Full ROM w/o pain or tenderness. No lymphadenopathy.  RESPIRATORY: Chest wall symmetrical. Respirations even and non-labored. Breath sounds clear to auscultation bilaterally.  CARDIAC: S1, S2 present, regular rate and rhythm. Peripheral  pulses 2+ bilaterally.  MSK: Muscle tone and strength appropriate for age. Joints w/o tenderness, redness, or swelling.  EXTREMITIES: Without clubbing, cyanosis, or edema.  NEUROLOGIC: No motor or sensory deficits. Steady, even gait.  PSYCH/MENTAL STATUS: Alert, oriented x 3. Cooperative, appropriate mood and affect.   Health Maintenance Due  Topic Date Due   Hepatitis C Screening  Never done   DTaP/Tdap/Td (1 - Tdap) Never done   Cervical Cancer Screening (HPV/Pap Cotest)  Never done   Colonoscopy  Never done   COVID-19 Vaccine (1 - 2024-25 season) Never done   MAMMOGRAM  Never done   Zoster Vaccines- Shingrix (1 of 2) Never done    No results found for any visits on 04/08/24.  Assessment & Plan:   No orders of the defined types were placed in this encounter.  No orders of the defined types were placed in this encounter.   No follow-ups on file.   Gavin Kast, FNP

## 2024-06-24 ENCOUNTER — Inpatient Hospital Stay (HOSPITAL_COMMUNITY)
Admission: AD | Admit: 2024-06-24 | Discharge: 2024-07-02 | DRG: 885 | Disposition: A | Discharge status: Home / Self Care | Source: Intra-hospital | Attending: Psychiatry | Admitting: Psychiatry

## 2024-06-24 ENCOUNTER — Other Ambulatory Visit: Payer: Self-pay

## 2024-06-24 ENCOUNTER — Encounter (HOSPITAL_COMMUNITY): Payer: Self-pay

## 2024-06-24 ENCOUNTER — Ambulatory Visit (HOSPITAL_COMMUNITY): Admission: EM | Admit: 2024-06-24 | Discharge: 2024-06-24 | Attending: Psychiatry | Admitting: Psychiatry

## 2024-06-24 DIAGNOSIS — F411 Generalized anxiety disorder: Secondary | ICD-10-CM | POA: Diagnosis present

## 2024-06-24 DIAGNOSIS — F331 Major depressive disorder, recurrent, moderate: Secondary | ICD-10-CM | POA: Diagnosis not present

## 2024-06-24 DIAGNOSIS — F4001 Agoraphobia with panic disorder: Secondary | ICD-10-CM | POA: Diagnosis not present

## 2024-06-24 DIAGNOSIS — G4700 Insomnia, unspecified: Secondary | ICD-10-CM | POA: Diagnosis not present

## 2024-06-24 DIAGNOSIS — Z653 Problems related to other legal circumstances: Secondary | ICD-10-CM

## 2024-06-24 DIAGNOSIS — Z79899 Other long term (current) drug therapy: Secondary | ICD-10-CM | POA: Diagnosis not present

## 2024-06-24 DIAGNOSIS — F4310 Post-traumatic stress disorder, unspecified: Secondary | ICD-10-CM | POA: Insufficient documentation

## 2024-06-24 DIAGNOSIS — F329 Major depressive disorder, single episode, unspecified: Secondary | ICD-10-CM | POA: Diagnosis not present

## 2024-06-24 DIAGNOSIS — F410 Panic disorder [episodic paroxysmal anxiety] without agoraphobia: Secondary | ICD-10-CM | POA: Diagnosis not present

## 2024-06-24 DIAGNOSIS — F909 Attention-deficit hyperactivity disorder, unspecified type: Secondary | ICD-10-CM | POA: Diagnosis not present

## 2024-06-24 DIAGNOSIS — F339 Major depressive disorder, recurrent, unspecified: Secondary | ICD-10-CM

## 2024-06-24 DIAGNOSIS — Z560 Unemployment, unspecified: Secondary | ICD-10-CM

## 2024-06-24 DIAGNOSIS — F1910 Other psychoactive substance abuse, uncomplicated: Secondary | ICD-10-CM | POA: Insufficient documentation

## 2024-06-24 DIAGNOSIS — F1510 Other stimulant abuse, uncomplicated: Secondary | ICD-10-CM

## 2024-06-24 DIAGNOSIS — R440 Auditory hallucinations: Secondary | ICD-10-CM | POA: Insufficient documentation

## 2024-06-24 DIAGNOSIS — F1994 Other psychoactive substance use, unspecified with psychoactive substance-induced mood disorder: Secondary | ICD-10-CM | POA: Insufficient documentation

## 2024-06-24 DIAGNOSIS — F15129 Other stimulant abuse with intoxication, unspecified: Secondary | ICD-10-CM | POA: Diagnosis present

## 2024-06-24 DIAGNOSIS — F332 Major depressive disorder, recurrent severe without psychotic features: Secondary | ICD-10-CM | POA: Insufficient documentation

## 2024-06-24 LAB — COMPREHENSIVE METABOLIC PANEL WITH GFR
ALT: 16 U/L (ref 0–44)
AST: 19 U/L (ref 15–41)
Albumin: 4 g/dL (ref 3.5–5.0)
Alkaline Phosphatase: 50 U/L (ref 38–126)
Anion gap: 11 (ref 5–15)
BUN: 12 mg/dL (ref 6–20)
CO2: 23 mmol/L (ref 22–32)
Calcium: 9.8 mg/dL (ref 8.9–10.3)
Chloride: 101 mmol/L (ref 98–111)
Creatinine, Ser: 0.78 mg/dL (ref 0.44–1.00)
GFR, Estimated: >60 mL/min (ref >60)
Glucose, Bld: 97 mg/dL (ref 70–99)
Potassium: 4.2 mmol/L (ref 3.5–5.1)
Sodium: 135 mmol/L (ref 135–145)
Total Bilirubin: 0.4 mg/dL (ref 0.0–1.2)
Total Protein: 7.3 g/dL (ref 6.5–8.1)

## 2024-06-24 LAB — ETHANOL: Alcohol, Ethyl (B): <15 mg/dL (ref <15)

## 2024-06-24 LAB — CBC WITH DIFFERENTIAL/PLATELET
Abs Immature Granulocytes: 0.02 K/uL (ref 0.00–0.07)
Basophils Absolute: 0.1 K/uL (ref 0.0–0.1)
Basophils Relative: 1 %
Eosinophils Absolute: 0.1 K/uL (ref 0.0–0.5)
Eosinophils Relative: 1 %
HCT: 34.9 % — ABNORMAL LOW (ref 36.0–46.0)
Hemoglobin: 10.4 g/dL — ABNORMAL LOW (ref 12.0–15.0)
Immature Granulocytes: 0 %
Lymphocytes Relative: 38 %
Lymphs Abs: 3.8 K/uL (ref 0.7–4.0)
MCH: 21.6 pg — ABNORMAL LOW (ref 26.0–34.0)
MCHC: 29.8 g/dL — ABNORMAL LOW (ref 30.0–36.0)
MCV: 72.4 fL — ABNORMAL LOW (ref 80.0–100.0)
Monocytes Absolute: 0.7 K/uL (ref 0.1–1.0)
Monocytes Relative: 7 %
Neutro Abs: 5.5 K/uL (ref 1.7–7.7)
Neutrophils Relative %: 53 %
Platelets: 367 K/uL (ref 150–400)
RBC: 4.82 MIL/uL (ref 3.87–5.11)
RDW: 19.3 % — ABNORMAL HIGH (ref 11.5–15.5)
WBC: 10.2 K/uL (ref 4.0–10.5)
nRBC: 0 % (ref 0.0–0.2)

## 2024-06-24 LAB — POCT URINE DRUG SCREEN - MANUAL ENTRY (I-SCREEN)
POC Amphetamine UR: NOT DETECTED
POC Buprenorphine (BUP): NOT DETECTED
POC Cocaine UR: NOT DETECTED
POC Marijuana UR: NOT DETECTED
POC Methadone UR: NOT DETECTED
POC Methamphetamine UR: POSITIVE — AB
POC Morphine: NOT DETECTED
POC Oxazepam (BZO): NOT DETECTED
POC Oxycodone UR: NOT DETECTED
POC Secobarbital (BAR): NOT DETECTED

## 2024-06-24 LAB — TSH: TSH: 3.364 u[IU]/mL (ref 0.350–4.500)

## 2024-06-24 LAB — POC URINE PREG, ED: Preg Test, Ur: NEGATIVE

## 2024-06-24 MED ORDER — NAPROXEN 500 MG PO TABS: 500.0000 mg | ORAL_TABLET | Freq: Two times a day (BID) | ORAL | Status: DC | PRN

## 2024-06-24 MED ORDER — OLANZAPINE 10 MG IM SOLR: 5.0000 mg | Freq: Three times a day (TID) | INTRAMUSCULAR | Status: DC | PRN

## 2024-06-24 MED ORDER — LORAZEPAM 2 MG/ML IJ SOLN: 2.0000 mg | Freq: Three times a day (TID) | INTRAMUSCULAR | Status: DC | PRN

## 2024-06-24 MED ORDER — METHOCARBAMOL 500 MG PO TABS: 500.0000 mg | ORAL_TABLET | Freq: Three times a day (TID) | ORAL | Status: DC | PRN

## 2024-06-24 MED ORDER — DIPHENHYDRAMINE HCL 50 MG/ML IJ SOLN: 50.0000 mg | Freq: Three times a day (TID) | INTRAMUSCULAR | Status: DC | PRN

## 2024-06-24 MED ORDER — MAGNESIUM HYDROXIDE 400 MG/5ML PO SUSP
30.0000 mL | Freq: Every day | ORAL | Status: DC | PRN
Start: 2024-06-24 — End: 2024-07-02

## 2024-06-24 MED ORDER — DICYCLOMINE HCL 20 MG PO TABS: 20.0000 mg | ORAL_TABLET | Freq: Four times a day (QID) | ORAL | Status: DC | PRN

## 2024-06-24 MED ORDER — DIPHENHYDRAMINE HCL 25 MG PO CAPS
50.0000 mg | ORAL_CAPSULE | Freq: Three times a day (TID) | ORAL | Status: DC | PRN
Start: 2024-06-24 — End: 2024-07-02

## 2024-06-24 MED ORDER — HYDROXYZINE HCL 25 MG PO TABS: 25.0000 mg | ORAL_TABLET | Freq: Three times a day (TID) | ORAL | Status: DC | PRN

## 2024-06-24 MED ORDER — ACETAMINOPHEN 325 MG PO TABS: 650.0000 mg | ORAL_TABLET | Freq: Four times a day (QID) | ORAL | Status: DC | PRN

## 2024-06-24 MED ORDER — ALUM & MAG HYDROXIDE-SIMETH 200-200-20 MG/5ML PO SUSP: 30.0000 mL | ORAL | Status: DC | PRN

## 2024-06-24 MED ORDER — OLANZAPINE 10 MG IM SOLR: 10.0000 mg | Freq: Three times a day (TID) | INTRAMUSCULAR | Status: DC | PRN

## 2024-06-24 MED ORDER — LOPERAMIDE HCL 2 MG PO CAPS: 2.0000 mg | ORAL_CAPSULE | ORAL | Status: DC | PRN

## 2024-06-24 MED ORDER — OLANZAPINE 5 MG PO TBDP: 5.0000 mg | ORAL_TABLET | Freq: Three times a day (TID) | ORAL | Status: DC | PRN

## 2024-06-24 MED ORDER — MAGNESIUM HYDROXIDE 400 MG/5ML PO SUSP: 30.0000 mL | Freq: Every day | ORAL | Status: DC | PRN

## 2024-06-24 MED ORDER — ONDANSETRON 4 MG PO TBDP: 4.0000 mg | ORAL_TABLET | Freq: Four times a day (QID) | ORAL | Status: DC | PRN

## 2024-06-24 MED ORDER — HYDROXYZINE HCL 25 MG PO TABS: 25.0000 mg | ORAL_TABLET | Freq: Four times a day (QID) | ORAL | Status: DC | PRN

## 2024-06-24 MED ORDER — HALOPERIDOL LACTATE 5 MG/ML IJ SOLN: 10.0000 mg | Freq: Three times a day (TID) | INTRAMUSCULAR | Status: DC | PRN

## 2024-06-24 MED ORDER — HALOPERIDOL LACTATE 5 MG/ML IJ SOLN: 5.0000 mg | Freq: Three times a day (TID) | INTRAMUSCULAR | Status: DC | PRN

## 2024-06-24 MED ORDER — HALOPERIDOL 5 MG PO TABS: 5.0000 mg | ORAL_TABLET | Freq: Three times a day (TID) | ORAL | Status: DC | PRN

## 2024-06-24 NOTE — Discharge Instructions (Addendum)
 Follow-up recommendations:  Activity:  Normal, as tolerated Diet:  Per PCP recommendation  Patient is instructed prior to discharge to: Take all medications as prescribed by her mental healthcare provider. Report any adverse effects and/or reactions from the medicines to her outpatient provider promptly. Patient has been instructed & cautioned: To not engage in alcohol and or illegal drug use while on prescription medicines.  In the event of worsening symptoms, patient is instructed to call the crisis hotline at 988, 911 and or go to the nearest ED for appropriate evaluation and treatment of symptoms. To follow-up with her primary care provider for your other medical issues, concerns and or health care needs.    Outpatient Services for Therapy and Medication Management for Medicaid  Based on what you have shared, a list of resources for outpatient therapy and psychiatry is provided below to get you started back on treatment.  It is imperative that you follow through with treatment within 5-7 days from the day of discharge to prevent any further risk to your safety or mental well-being.  You are not limited to the list provided.  In case of an urgent crisis, you may contact the Mobile Crisis Unit with [facility] at [phone].          [facility] [address], SECOND FLOOR [place], [place] [address] [phone] OUTPATIENT Walk-in information: Please note, all walk-ins are first come & first serve, with limited number of availability.  Please note that to be eligible for services you must bring: ID or a piece of mail with your name [facility] address  Therapist for therapy:  Monday & Wednesdays: Please ARRIVE at 7:15 AM for registration Will START at 8:00 AM Every 1st & 2nd Friday of the month: Please ARRIVE at 10:15 AM for registration Will START at 1 PM - 5 PM  Psychiatrist for medication management: Monday - Friday:  Please  ARRIVE at 7:15 AM for registration Will START at 8:00 AM  Regretfully, due to limited availability, please be aware that you may not been seen on the same day as walk-in. Please consider making an appoint or try again. Thank you for your patience and understanding.    [facility] [address] [place], [place], [address] [phone] phone  [facility] [address] [place], [place], [address] [phone] phone [phone] phone ([address], [address], Anthem/Elevance, [address], [place], [address], [address], Healthy [place], [place], [place], [address], [employer]ocoPhillips, Wiggins, UHC, American Financial, [place], Out of Network)  [employer], [place] [address] [place], [place], [address] [phone] phone ([place], Anthem/Elevance, Sanmina-SCI Options/Carelon, BCBS, [address], [place], [place], [place], [place], [employer], [place], [place], [place], [facility])  [employer] [facility] [address] [place], [place], [address] [phone] phone (Medicaid, ask about other insurance)  [facility] [address] [place], [place], [address] [phone] phone [phone] fax ([address] [place] , [place], [place], [place] Health Choice, UHC, General Electric, Self-Pay)  Sarah Lempka [address] [place], [place], [address] [phone] phone ([address] Anthem/Elevance, [address], [address], [place], CSX Corporation, [place], [place], [place], [employer], [place], [place], [place], [facility])  [facility] - 6-8 MONTH WAIT FOR THERAPY; SOONER FOR MEDICATION MANAGEMENT [address], [address] [place], [place], [address] [address] phone ([address] AmeriHealth [place] - , [address], [place], [place], Friday Health Plans, [address], BCBS Healthy [place], [place], [address], [place], [place], [place], [place], Abbott, [place], [facility]  Community Plan,  Eli Lilly and Company)  [facility] [address] [place], [place], [address] [phone] phone  [facility] [address], [address] [place], [place], [address] [phone] phone  [facility] [address], [address] [place], [place], [address] [phone] phone  [facility] - THERAPY ONLY [address]  [place], [place], [address] [phone] phone  [facility], [place] [address], [place], [address] [phone] phone  [facility] [address] [place], [place], [address] [phone] phone [phone] fax  [facility] [address] [place], [place], [address] [phone] phone [phone] fax  [facility] [phone] [address] [place], [place], [address] [phone] phone  Janit Griffins [address] [place], [place], [address]  [phone] phone  [facility]  (Adults Only) [address] [place], [place], [address]  [phone] phone [phone] fax    Medicaid  and Non-Insured   SUBSTANCE USE TREATMENT FACILITIES   [facility] [address], [place], [address] [phone] phone NOTE: [facility] is no longer offering IOP services.  Serves those who are low-income or have no insurance.  [facility] [address] [place], [place], [address] [phone] phone [phone] fax NOTE: Does have Substance Abuse-Intensive Outpatient Program [facility]) as well as transitional housing if eligible.  [facility] [address] [place], [place], [address] [phone] phone [phone] fax  [facility] [phone] [place], [place], [address] [phone] phone [phone] fax

## 2024-06-24 NOTE — Progress Notes (Signed)
 06/24/24 1600  Psych Admission Type (Psych Patients Only)  Admission Status Voluntary  Psychosocial Assessment  Patient Complaints Substance abuse;Anxiety  Eye Contact Avoids  Facial Expression Animated  Affect Anxious  Speech Soft  Interaction Assertive  Motor Activity Fidgety  Appearance/Hygiene In scrubs  Behavior Characteristics Appropriate to situation;Cooperative  Mood Anxious  Thought Process  Coherency WDL  Content WDL  Delusions None reported or observed  Perception WDL  Hallucination None reported or observed  Judgment WDL  Confusion None  Danger to Self  Current suicidal ideation? Denies  Description of Suicide Plan none  Agreement Not to Harm Self Yes  Description of Agreement verbal  Danger to Others  Danger to Others None reported or observed

## 2024-06-24 NOTE — ED Notes (Signed)
 Latoya Schaefer arrived to the [facility] for observation. Patient is A&Ox4. Patient denies suicidal ideations or homicidal ideations . Pt contracts for safety. Patient denies any auditory hallucinations, visual hallucinations, or CAH. Skin check conducted by Corean PEAK and [place], M[facility] . Patient oriented to the unit and provided food, beverage, and snack. Patient verbalized understanding. Patient currently resting in bed. No distress noted.
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
## 2024-06-24 NOTE — ED Notes (Signed)
 Patient transferred to [facility]. Patient belongings returned complete and intact. Belongings given to transport provider. Staff escorted patient to back sallyport for transportation to their destination. Safety maintained.

## 2024-06-24 NOTE — Plan of Care (Signed)
  Problem: Education: Goal: Knowledge of [place] [facility] Education information/materials will improve Outcome: Progressing Goal: Mental status will improve Outcome: Progressing   Problem: Activity: Goal: Interest or engagement in activities will improve Outcome: Progressing   
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
## 2024-06-24 NOTE — Tx Team (Signed)
 Initial Treatment Plan 06/24/2024 4:50 PM [name] FMW:[id number]    PATIENT STRESSORS: Substance abuse     PATIENT STRENGTHS: Communication skills  Supportive family/friends    PATIENT IDENTIFIED PROBLEMS: Anxiety  Substance abuse                   DISCHARGE CRITERIA:  Ability to meet basic life and health needs Motivation to continue treatment in a less acute level of care  PRELIMINARY DISCHARGE PLAN: Attend aftercare/continuing care group Outpatient therapy  PATIENT/FAMILY INVOLVEMENT: This treatment plan has been presented to and reviewed with the patient, Latoya Schaefer. The patient has been given the opportunity to ask questions and make suggestions.  Huel Mall, RN 06/24/2024, 4:50 PM
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
## 2024-06-24 NOTE — ED Notes (Signed)
 Patient alert & oriented x4. Denies intent to harm self or others when asked. Denies A/VH. Patient denies any physical complaints when asked. No acute distress noted. Support and encouragement provided. Patient observed in milieu. No inappropriate behaviors seen or reported. Routine safety checks conducted per facility protocol. Encouraged patient to notify staff if any thoughts of harm towards self or others arise. Patient verbalizes understanding and agreement.

## 2024-06-24 NOTE — ED Notes (Signed)
 Patient resting in lounger. Calm, collected, no physical complaints at this time. Patient in no apparent acute distress. No inappropriate behaviors observed or reported at this time. Environment secured. Safety checks in place per facility protocol.

## 2024-06-25 DIAGNOSIS — F410 Panic disorder [episodic paroxysmal anxiety] without agoraphobia: Principal | ICD-10-CM | POA: Diagnosis present

## 2024-06-25 DIAGNOSIS — F411 Generalized anxiety disorder: Secondary | ICD-10-CM | POA: Diagnosis present

## 2024-06-25 DIAGNOSIS — F1510 Other stimulant abuse, uncomplicated: Secondary | ICD-10-CM

## 2024-06-25 LAB — VITAMIN B12: Vitamin B-12: 238 pg/mL (ref 180–914)

## 2024-06-25 LAB — TSH: TSH: 2.064 u[IU]/mL (ref 0.350–4.500)

## 2024-06-25 LAB — VITAMIN D 25 HYDROXY (VIT D DEFICIENCY, FRACTURES): Vit D, 25-Hydroxy: 29.55 ng/mL — ABNORMAL LOW (ref 30–100)

## 2024-06-25 LAB — FOLATE: Folate: 11.8 ng/mL (ref >5.9)

## 2024-06-25 LAB — MAGNESIUM: Magnesium: 2.2 mg/dL (ref 1.7–2.4)

## 2024-06-25 MED ORDER — CLONAZEPAM 0.5 MG PO TABS
1.0000 mg | ORAL_TABLET | Freq: Two times a day (BID) | ORAL | Status: DC | PRN
  Administered 2024-06-25 – 2024-06-30 (×8): 1 mg via ORAL
  Filled 2024-06-25 (×8): qty 2

## 2024-06-25 MED ORDER — ESCITALOPRAM OXALATE 5 MG PO TABS
15.0000 mg | ORAL_TABLET | Freq: Every day | ORAL | Status: DC
  Administered 2024-06-26: 15 mg via ORAL
  Filled 2024-06-25: qty 1

## 2024-06-25 MED ORDER — ESCITALOPRAM OXALATE 10 MG PO TABS
10.0000 mg | ORAL_TABLET | Freq: Once | ORAL | Status: AC
  Administered 2024-06-25: 10 mg via ORAL
  Filled 2024-06-25: qty 1

## 2024-06-25 NOTE — Progress Notes (Signed)
(  Sleep Hours) - 8.5 (Any PRNs that were needed, meds refused, or side effects to meds)- none (Any disturbances and when (visitation, over night)- none (Concerns raised by the patient)- none  (SI/HI/AVH)- denies

## 2024-06-25 NOTE — Progress Notes (Signed)
(  Sleep Hours) - 7.5 (Any PRNs that were needed, meds refused, or side effects to meds)- none (Any disturbances and when (visitation, over night)-none (Concerns raised by the patient)-none   (SI/HI/AVH)- denies

## 2024-06-25 NOTE — [facility] Suicide Risk Assessment (Signed)
 [facility] INPATIENT:  Family/Significant Other Suicide Prevention Education  Suicide Prevention Education:  Contact Attempts: Norleen (father) [phone] ,  has been identified by the patient as the family member/significant other with whom the patient will be residing, and identified as the person(s) who will aid the patient in the event of a mental health crisis.  With written consent from the patient, two attempts were made to provide suicide prevention education, prior to and/or following the patient's discharge.  We were unsuccessful in providing suicide prevention education.  A suicide education pamphlet was given to the patient to share with family/significant other.  Date and time of first attempt: 06/26/23 at 1:40 pm Date and time of second attempt: attempt needed   Latoya Schaefer 06/25/2024, 1:40 PM

## 2024-06-25 NOTE — Progress Notes (Signed)
   06/25/24 1400  Psych Admission Type (Psych Patients Only)  Admission Status Voluntary  Psychosocial Assessment  Patient Complaints Substance abuse;Anxiety  Eye Contact Avoids  Facial Expression Animated  Affect Anxious  Speech Soft  Interaction Assertive  Motor Activity Fidgety  Appearance/Hygiene In scrubs  Behavior Characteristics Appropriate to situation  Mood Anxious  Thought Process  Coherency WDL  Content WDL  Delusions None reported or observed  Perception WDL  Hallucination None reported or observed  Judgment WDL  Confusion None  Danger to Self  Current suicidal ideation? Denies  Agreement Not to Harm Self Yes  Description of Agreement verbal  Danger to Others  Danger to Others None reported or observed

## 2024-06-25 NOTE — Plan of Care (Signed)

## 2024-06-25 NOTE — Group Note (Signed)
 Recreation Therapy Group Note   Group Topic:Leisure Education  Group Date: 06/25/2024 Start Time: 0934 End Time: 1013 Facilitators: Demario Faniel-McCall, LRT,CTRS Location: [address] Day[address]   Group Topic: Leisure Education  Goal Area(s) Addresses:  Patient will identify positive leisure activities.  Patient will identify one positive benefit of participation in leisure activities.   Behavioral Response: Engaged  Intervention: Leisure Group Game  Activity: Patients and LRT participated in playing a trivia game of Guess the [place]. Patients were divided into 2 group. Patients spin the spinner and whatever category ([address] Dance, [place], [place], Hip-Hop and R&B) they land on LRT reads the lyrics. If the team guesses the missing quote, they get the card, if not the other team gets a chance to steal the point.   Education:  Leisure Exposure, Pharmacist, community, Discharge Planning  Education Outcome: Acknowledges education/In group clarification offered/Needs additional education   Affect/Mood: Appropriate   Participation Level: Engaged   Participation Quality: Independent   Behavior: Appropriate   Speech/Thought Process: Focused   Insight: Good   Judgement: Good   Modes of Intervention: Competitive Play   Patient Response to Interventions:  Engaged   Education Outcome:  In group clarification offered    Clinical Observations/Individualized Feedback: Pt was engaged and social with peers. Pt was focused and very involved in helping team determine the answers to the questions. Pt was bright throughout group session.      Plan: Continue to engage patient in RT group sessions 2-3x/week.   Bryndan Bilyk-McCall, LRT,CTRS 06/25/2024 12:44 PM

## 2024-06-25 NOTE — Group Note (Signed)
 Date:  06/25/2024 Time:  1:25 PM  Group Topic/Focus:   Music Therapy:   The goal of this group is to increase self-awareness, emotional expression, autonomy, and self esteem through music. More specifically, a lyric substitution activity.    Participation Level:  Did Not Attend   Latoya Schaefer 06/25/2024, 1:25 PM

## 2024-06-25 NOTE — Group Note (Signed)
 Date:  06/25/2024 Time:  9:28 AM  Group Topic/Focus:  Goals Group:   The focus of this group is to help patients establish daily goals to achieve during treatment and discuss how the patient can incorporate goal setting into their daily lives to aide in recovery. Orientation:   The focus of this group is to educate the patient on the purpose and policies of crisis stabilization and provide a format to answer questions about their admission.  The group details unit policies and expectations of patients while admitted.    Participation Level:  Did Not Attend  Latoya Schaefer 06/25/2024, 9:28 AM
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
## 2024-06-26 ENCOUNTER — Encounter (HOSPITAL_COMMUNITY): Payer: Self-pay

## 2024-06-26 DIAGNOSIS — F331 Major depressive disorder, recurrent, moderate: Secondary | ICD-10-CM | POA: Diagnosis not present

## 2024-06-26 DIAGNOSIS — F4001 Agoraphobia with panic disorder: Secondary | ICD-10-CM

## 2024-06-26 DIAGNOSIS — F411 Generalized anxiety disorder: Secondary | ICD-10-CM

## 2024-06-26 MED ORDER — ESCITALOPRAM OXALATE 20 MG PO TABS
20.0000 mg | ORAL_TABLET | Freq: Every day | ORAL | Status: DC
  Administered 2024-06-27 – 2024-07-02 (×6): 20 mg via ORAL
  Filled 2024-06-26 (×8): qty 2

## 2024-06-26 NOTE — Plan of Care (Signed)
   Problem: Education: Goal: Knowledge of [facility] [place] [facility] Education information/materials will improve Outcome: Progressing Goal: Emotional status will improve Outcome: Progressing Goal: Mental status will improve Outcome: Progressing Goal: Verbalization of understanding the information provided will improve Outcome: Progressing

## 2024-06-26 NOTE — Progress Notes (Signed)
(  Sleep Hours) - (Any PRNs that were needed, meds refused, or side effects to meds)- none (Any disturbances and when (visitation, over night)-none (Concerns raised by the patient)- none (SI/HI/AVH)- Denies all
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
## 2024-06-26 NOTE — Progress Notes (Addendum)
 D. Pt remained in bed for much of the shift- continues to complain of anxiety and some light headedness. Pt's BP has been consistently low, and has been encouraged to push fluids throughout the day. Pt currently denies SI/HI and AVH and doesn't appear to be responding to internal stimuli. A. Labs and vitals monitored. Pt given and educated on medications. Pt supported emotionally and encouraged to express concerns and ask questions.   R. Pt remains safe with 15 minute checks. Will continue POC.    06/26/24 1000  Psych Admission Type (Psych Patients Only)  Admission Status Voluntary  Psychosocial Assessment  Patient Complaints Anxiety  Eye Contact Brief  Facial Expression Animated  Affect Anxious  Speech Soft  Interaction Assertive  Motor Activity Fidgety  Appearance/Hygiene Unremarkable  Behavior Characteristics Cooperative;Appropriate to situation  Mood Anxious  Thought Process  Coherency WDL  Content WDL  Delusions None reported or observed  Perception WDL  Hallucination None reported or observed  Judgment WDL  Confusion None  Danger to Self  Current suicidal ideation? Denies  Agreement Not to Harm Self Yes  Description of Agreement agreed to contact staff before acting on harmful thoughts  Danger to Others  Danger to Others None reported or observed

## 2024-06-26 NOTE — Plan of Care (Signed)
   Problem: Education: Goal: Knowledge of [place] [facility] Education information/materials will improve Outcome: Progressing Goal: Verbalization of understanding the information provided will improve Outcome: Progressing

## 2024-06-26 NOTE — BH IP Treatment Plan (Signed)
 Interdisciplinary Treatment and Diagnostic Plan Update  06/25/2024 Time of Session: 1000 Latoya Schaefer MRN: [id number]  Principal Diagnosis: Panic disorder  Secondary Diagnoses: Principal Problem:   Panic disorder Active Problems:   MDD (major depressive disorder)   Generalized anxiety disorder   Current Medications:  Current Facility-Administered Medications  Medication Dose Route Frequency Provider Last Rate Last Admin   acetaminophen  (TYLENOL ) tablet 650 mg  650 mg Oral Q6H PRN Rollene Katz, MD       alum & mag hydroxide-simeth (MAALOX/MYLANTA) 200-200-20 MG/5ML suspension 30 mL  30 mL Oral Q4H PRN Rollene Katz, MD       clonazePAM  (KLONOPIN ) tablet 1 mg  1 mg Oral BID PRN Prentis Kitchens A, DO   1 mg at 06/26/24 0813   haloperidol  (HALDOL ) tablet 5 mg  5 mg Oral TID PRN Rollene Katz, MD       And   diphenhydrAMINE  (BENADRYL ) capsule 50 mg  50 mg Oral TID PRN Rollene Katz, MD       haloperidol  lactate (HALDOL ) injection 5 mg  5 mg Intramuscular TID PRN Rollene Katz, MD       And   diphenhydrAMINE  (BENADRYL ) injection 50 mg  50 mg Intramuscular TID PRN Rollene Katz, MD       And   LORazepam  (ATIVAN ) injection 2 mg  2 mg Intramuscular TID PRN Rollene Katz, MD       haloperidol  lactate (HALDOL ) injection 10 mg  10 mg Intramuscular TID PRN Rollene Katz, MD       And   diphenhydrAMINE  (BENADRYL ) injection 50 mg  50 mg Intramuscular TID PRN Rollene Katz, MD       And   LORazepam  (ATIVAN ) injection 2 mg  2 mg Intramuscular TID PRN Rollene Katz, MD       [START ON 06/27/2024] escitalopram  (LEXAPRO ) tablet 20 mg  20 mg Oral Daily Chalmers Iddings A, DO       hydrOXYzine  (ATARAX ) tablet 25 mg  25 mg Oral TID PRN Rollene Katz, MD       magnesium  hydroxide (MILK OF MAGNESIA) suspension 30 mL  30 mL Oral Daily PRN Rollene Katz, MD       PTA Medications: Medications Prior to Admission  Medication Sig Dispense Refill Last  Dose/Taking   alprazolam  (XANAX ) 2 MG tablet Take 2 mg by mouth 2 (two) times daily as needed for anxiety.   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   Taking As Needed   escitalopram  (LEXAPRO ) 20 MG tablet Take 20 mg by mouth daily.   Taking    Patient Stressors: Substance abuse    Patient Strengths: Manufacturing systems engineer  Supportive family/friends   Treatment Modalities: Medication Management, Group therapy, Case management,  1 to 1 session with clinician, Psychoeducation, Recreational therapy.   Physician Treatment Plan for Primary Diagnosis: Panic disorder Long Term Goal(s): Improvement in symptoms so as ready for discharge   Short Term Goals: Ability to identify changes in lifestyle to reduce recurrence of condition will improve Ability to identify and develop effective coping behaviors will improve Ability to maintain clinical measurements within normal limits will improve Compliance with prescribed medications will improve  Medication Management: Evaluate patient's response, side effects, and tolerance of medication regimen.  Therapeutic Interventions: 1 to 1 sessions, Unit Group sessions and Medication administration.  Evaluation of Outcomes: Progressing  Physician Treatment Plan for Secondary Diagnosis: Principal Problem:   Panic disorder  Active Problems:   MDD (major depressive disorder)   Generalized anxiety disorder  Long Term Goal(s): Improvement in symptoms so as ready for discharge   Short Term Goals: Ability to identify changes in lifestyle to reduce recurrence of condition will improve Ability to identify and develop effective coping behaviors will improve Ability to maintain clinical measurements within normal limits will improve Compliance with prescribed medications will improve     Medication Management: Evaluate patient's response, side effects, and tolerance  of medication regimen.  Therapeutic Interventions: 1 to 1 sessions, Unit Group sessions and Medication administration.  Evaluation of Outcomes: Progressing   RN Treatment Plan for Primary Diagnosis: Panic disorder Long Term Goal(s): Knowledge of disease and therapeutic regimen to maintain health will improve  Short Term Goals: Ability to remain free from injury will improve, Ability to verbalize frustration and anger appropriately will improve, Ability to demonstrate self-control, Ability to participate in decision making will improve, Ability to verbalize feelings will improve, Ability to disclose and discuss suicidal ideas, and Compliance with prescribed medications will improve  Medication Management: RN will administer medications as ordered by provider, will assess and evaluate patient's response and provide education to patient for prescribed medication. RN will report any adverse and/or side effects to prescribing provider.  Therapeutic Interventions: 1 on 1 counseling sessions, Psychoeducation, Medication administration, Evaluate responses to treatment, Monitor vital signs and CBGs as ordered, Perform/monitor CIWA, COWS, AIMS and Fall Risk screenings as ordered, Perform wound care treatments as ordered.  Evaluation of Outcomes: Progressing   LCSW Treatment Plan for Primary Diagnosis: Panic disorder Long Term Goal(s): Safe transition to appropriate next level of care at discharge, Engage patient in therapeutic group addressing interpersonal concerns.  Short Term Goals: Engage patient in aftercare planning with referrals and resources, Increase social support, Increase ability to appropriately verbalize feelings, Increase emotional regulation, and Facilitate acceptance of mental health diagnosis and concerns  Therapeutic Interventions: Assess for all discharge needs, 1 to 1 time with Social worker, Explore available resources and support systems, Assess for adequacy in community support  network, Educate family and significant other(s) on suicide prevention, Complete Psychosocial Assessment, Interpersonal group therapy.  Evaluation of Outcomes: Progressing   Progress in Treatment: Attending groups: Yes. Participating in groups: Yes. Taking medication as prescribed: Yes. Toleration medication: Yes. Family/Significant other contact made: No, will contact:  sister Patient understands diagnosis: Yes. Discussing patient identified problems/goals with staff: Yes. Medical problems stabilized or resolved: Yes. Denies suicidal/homicidal ideation: Yes. Issues/concerns per patient self-inventory: No.   Patient Goals:  Stabilize on anxiety medications  Discharge Plan or Barriers: Severe anxiety; suicidal ideation  Reason for Continuation of Hospitalization: Suicidal ideation  Estimated Length of Stay:  Last 3 [place] Suicide Severity Risk Score: Flowsheet Row [facility] (Current[facility] Most recent reading at 06/24/2024  4:00 PM ED from 06/24/2024 in [facility] Most recent reading at 06/24/2024  5:38 AM  C-SSRS RISK CATEGORY No Risk No Risk    Last PHQ 2/9 Scores:     No data to display          Scribe for Treatment Team: Oliva DELENA Salmon, DO 06/26/2024 3:24 PM

## 2024-06-26 NOTE — BHH Group Notes (Signed)
 Adult Psychoeducational Group Note   Date:  06/26/2024 Time:  9:10 AM   Group Topic/Focus:  Goals Group:   The focus of this group is to help patients establish daily goals to achieve during treatment and discuss how the patient can incorporate goal setting into their daily lives to aide in recovery.   Participation Level:  Did Not Attend     Additional Comments:  Pt was invited but did not attend orientation/goals group.   Niel CHRISTELLA Nightingale 06/26/2024, 9:10 AM

## 2024-06-27 DIAGNOSIS — F4001 Agoraphobia with panic disorder: Secondary | ICD-10-CM | POA: Diagnosis not present

## 2024-06-27 DIAGNOSIS — F411 Generalized anxiety disorder: Secondary | ICD-10-CM | POA: Diagnosis not present

## 2024-06-27 DIAGNOSIS — F4310 Post-traumatic stress disorder, unspecified: Secondary | ICD-10-CM | POA: Diagnosis not present

## 2024-06-27 DIAGNOSIS — F332 Major depressive disorder, recurrent severe without psychotic features: Secondary | ICD-10-CM | POA: Diagnosis not present

## 2024-06-27 MED ORDER — QUETIAPINE FUMARATE 50 MG PO TABS
50.0000 mg | ORAL_TABLET | Freq: Every day | ORAL | Status: DC
  Administered 2024-06-27 – 2024-07-01 (×5): 50 mg via ORAL
  Filled 2024-06-27 (×5): qty 1

## 2024-06-27 NOTE — Plan of Care (Signed)
   Problem: Education: Goal: Knowledge of [place] [facility] Education information/materials will improve Outcome: Progressing Goal: Verbalization of understanding the information provided will improve Outcome: Progressing

## 2024-06-27 NOTE — Plan of Care (Signed)
   Problem: Education: Goal: Knowledge of [facility] [place] [facility] Education information/materials will improve Outcome: Progressing Goal: Emotional status will improve Outcome: Progressing Goal: Mental status will improve Outcome: Progressing Goal: Verbalization of understanding the information provided will improve Outcome: Progressing

## 2024-06-27 NOTE — Group Note (Signed)
 Date:  06/27/2024 Time:  9:07 AM  Group Topic/Focus:  Goals Group:   The focus of this group is to help patients establish daily goals to achieve during treatment and discuss how the patient can incorporate goal setting into their daily lives to aide in recovery. Orientation:   The focus of this group is to educate the patient on the purpose and policies of crisis stabilization and provide a format to answer questions about their admission.  The group details unit policies and expectations of patients while admitted.    Participation Level:  Did Not Attend

## 2024-06-27 NOTE — Progress Notes (Signed)
 D. Pt presented very groggy this am, reported that she slept poorly last night , and stated , I don't think the Lexapro  is working because I'm still very anxious. Pt given prn clonazepam  for anxiety she rated a 7/10 this am.  Pt currently denies SI/HI and AVH  A. Labs and vitals monitored. Pt supported emotionally and encouraged to express concerns and ask questions.   R. Pt remains safe with 15 minute checks. Will continue POC.

## 2024-06-27 NOTE — B[facility] Group Notes (Signed)
Patient did not attend the Wrap-up group. 
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
## 2024-06-28 DIAGNOSIS — F332 Major depressive disorder, recurrent severe without psychotic features: Secondary | ICD-10-CM | POA: Diagnosis not present

## 2024-06-28 DIAGNOSIS — F411 Generalized anxiety disorder: Secondary | ICD-10-CM | POA: Diagnosis not present

## 2024-06-28 DIAGNOSIS — F4310 Post-traumatic stress disorder, unspecified: Secondary | ICD-10-CM | POA: Diagnosis not present

## 2024-06-28 DIAGNOSIS — F4001 Agoraphobia with panic disorder: Secondary | ICD-10-CM | POA: Diagnosis not present

## 2024-06-28 NOTE — Plan of Care (Signed)
   Problem: Education: Goal: Knowledge of [facility] [facility] Education information/materials will improve Outcome: Progressing Goal: Emotional status will improve Outcome: Progressing Goal: Mental status will improve Outcome: Progressing

## 2024-06-28 NOTE — Plan of Care (Signed)
 ?  Problem: Activity: ?Goal: Interest or engagement in activities will improve ?Outcome: Progressing ?Goal: Sleeping patterns will improve ?Outcome: Progressing ?  ?Problem: Coping: ?Goal: Ability to verbalize frustrations and anger appropriately will improve ?Outcome: Progressing ?Goal: Ability to demonstrate self-control will improve ?Outcome: Progressing ?  ?Problem: Safety: ?Goal: Periods of time without injury will increase ?Outcome: Progressing ?  ?

## 2024-06-28 NOTE — BHH Group Notes (Signed)
 BHH Group Notes:  (Nursing/MHT/Case Management/Adjunct)  Date:  06/28/2024  Time:  2000  Type of Therapy:  Alcoholics Anonymous Meeting  Participation Level:  Minimal  Participation Quality:  Resistant  Affect:  Depressed, Flat, and Resistant  Cognitive:  Alert  Insight:  Improving  Engagement in Group:  Limited  Modes of Intervention:  Clarification, Education, and Support  Summary of Progress/Problems: Pt joined the meeting after thirty minutes and left before it was over.   Lenora Manuelita RAMAN 06/28/2024, 9:31 PM

## 2024-06-28 NOTE — BHH Group Notes (Signed)

## 2024-06-28 NOTE — Progress Notes (Signed)
   06/28/24 0915  Psych Admission Type (Psych Patients Only)  Admission Status Voluntary  Psychosocial Assessment  Patient Complaints Anxiety;Depression  Eye Contact Fair  Facial Expression Animated  Affect Anxious  Speech Logical/coherent  Interaction Assertive  Motor Activity Other (Comment) (WNL)  Appearance/Hygiene Unremarkable  Behavior Characteristics Cooperative;Appropriate to situation  Mood Anxious  Thought Process  Coherency WDL  Content WDL  Delusions None reported or observed  Perception Hallucinations  Hallucination Auditory (Pt reports hearnig voices calling her name.)  Judgment Impaired  Confusion None  Danger to Self  Current suicidal ideation? Denies  Agreement Not to Harm Self Yes  Description of Agreement Verbal  Danger to Others  Danger to Others None reported or observed

## 2024-06-28 NOTE — Progress Notes (Signed)
(  Sleep Hours) -7.75 (Any PRNs that were needed, meds refused, or side effects to meds)- Pt was anxious when she came to window to get her night meds and she requested klonopin  so she would not have a panic attack, prn given at 2120 (Any disturbances and when (visitation, over night)-none  (Concerns raised by the patient)- none (SI/HI/AVH)- Denies all
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
## 2024-06-28 NOTE — Group Note (Signed)
 Recreation Therapy Group Note   Group Topic:Health and Wellness  Group Date: 06/28/2024 Start Time: 0930 End Time: 0955 Facilitators: Ndidi Nesby-McCall, LRT,CTRS Location: [address] Dayroom   Group Topic: Wellness   Goal Area(s) Addresses:  Patient will define components of whole wellness. Patient will verbalize benefit of whole wellness.   Behavioral Response:    Intervention: Music   Activity: LRT and patients engaged in a series of exercises. Patients took turns leading the group in various exercises of their choosing. The easiness or difficulty of the exercises were dependent on what each patient chose to do. LRT and patients engaged in exercise for at least 15 minutes. Patients could get water if needed or take breaks when needed as well.    Education: Wellness, Building control surveyor.    Education Outcome: Acknowledges education/In group clarification offered/Needs additional education.    Affect/Mood: N/A   Participation Level: Did not attend    Clinical Observations/Individualized Feedback:     Plan: Continue to engage patient in RT group sessions 2-3x/week.   Mishti Swanton-McCall, LRT,CTRS 06/28/2024 12:57 PM

## 2024-06-29 DIAGNOSIS — F332 Major depressive disorder, recurrent severe without psychotic features: Secondary | ICD-10-CM | POA: Diagnosis not present

## 2024-06-29 DIAGNOSIS — F4310 Post-traumatic stress disorder, unspecified: Secondary | ICD-10-CM

## 2024-06-29 DIAGNOSIS — F411 Generalized anxiety disorder: Secondary | ICD-10-CM | POA: Diagnosis not present

## 2024-06-29 DIAGNOSIS — F4001 Agoraphobia with panic disorder: Secondary | ICD-10-CM | POA: Diagnosis not present

## 2024-06-29 LAB — LIPID PANEL
Cholesterol: 189 mg/dL (ref 0–200)
HDL: 58 mg/dL (ref >40)
LDL Cholesterol: 114 mg/dL — ABNORMAL HIGH (ref 0–99)
Total CHOL/HDL Ratio: 3.3 ratio
Triglycerides: 86 mg/dL (ref <150)
VLDL: 17 mg/dL (ref 0–40)

## 2024-06-29 LAB — HEMOGLOBIN A1C
Hgb A1c MFr Bld: 5.2 % (ref 4.8–5.6)
Mean Plasma Glucose: 102.54 mg/dL

## 2024-06-29 NOTE — Progress Notes (Addendum)
 Patient denies SI/HI/AVH this morning. Pt rates her depression a 8/10 and anxiety a 8/10. Pt presents with a flat and depressed affect/ mood. Pt has been cooperative throughout the day. Patient has been compliant with medications. Q 15 minute safety checks are in place for patient's safety. Patient is currently safe on the unit.   06/29/24 0800  Psych Admission Type (Psych Patients Only)  Admission Status Voluntary  Psychosocial Assessment  Patient Complaints Anxiety;Depression (Anxiety 8/10, Depression 8/10)  Eye Contact Fair  Facial Expression Flat;Sad  Affect Anxious;Depressed  Speech Logical/coherent  Interaction Assertive  Motor Activity Fidgety  Appearance/Hygiene Unremarkable  Behavior Characteristics Cooperative  Mood Depressed;Anxious  Thought Process  Coherency WDL  Content WDL  Delusions None reported or observed  Perception WDL  Hallucination None reported or observed  Judgment Impaired  Confusion None  Danger to Self  Current suicidal ideation? Denies  Description of Suicide Plan No plan  Agreement Not to Harm Self Yes  Description of Agreement Pt verbally contracts for safety  Danger to Others  Danger to Others None reported or observed

## 2024-06-29 NOTE — Progress Notes (Signed)
 Patient ID: Latoya Schaefer, female   DOB: Oct 02, 1971, 53 y.o.   MRN: [id number] CSW spoke with Pt on the unit and made her aware that father had been contacted. CSW explained need to reach out to her mother to confirm that she has appropriate code so that they may reach her here. Briefly discussed pending criminal charges and she admits to likely missing Court dates while here. CSW discussed with Pt her thoughts on treatment moving forward, suggesting possible substance abuse treatment. She feels that her mental health and PTSD are primary needs for treatment. Attempted to explained to Pt the importance of sobriety while also working through trauma and other mental health needs. Pt stated wanting somewhere to go after here as she feels if she left now she would not fare well in the community. CSW explained that there are little to no inpatient options that would strictly address her mental health concerns while also being covered by her insurance provider. Upon chart review, it appears Pt's participation in group therapy have been minimal.   CSW conducted online search of [place]  [employer] portal and it does appear that Pt has pending criminal charges,  with failure to appear / bond forfeiture. Failure to appear occurred on 06/15/2024 and 06/16/2024 both of which are  PRIOR to admission to this facility, therefore CSW is unable to provide excuse for missed Court appearances.    CSW team will continue to follow for care coordination and discharge planning needs.    Ettore Trebilcock N Edson Deridder, LCSW 06/29/24 6:33 PM

## 2024-06-29 NOTE — Group Note (Signed)
 Date:  06/29/2024 Time:  9:56 AM  Group Topic/Focus:  Goals Group:   The focus of this group is to help patients establish daily goals to achieve during treatment and discuss how the patient can incorporate goal setting into their daily lives to aide in recovery.    Participation Level:  Did Not Attend  Participation Quality:    Affect:    Cognitive:    Insight:   Engagement in Group:    Modes of Intervention:    Additional Comments:    Piedad Standiford 06/29/2024, 9:56 AM
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
## 2024-06-29 NOTE — Group Note (Signed)
 Recreation Therapy Group Note   Group Topic:Animal Assisted Therapy   Group Date: 06/29/2024 Start Time: 0950 End Time: 1030 Facilitators: Develle Sievers-McCall, LRT,CTRS Location: [address] Dayroom   Animal-Assisted Activity (AAA) Program Checklist/Progress Notes Patient Eligibility Criteria Checklist & Daily Group note for Rec Tx Intervention  AAA/T Program Assumption of Risk Form signed by Patient/ or Parent Legal Guardian Yes  Patient understands his/her participation is voluntary Yes  Behavioral Response:    Education: Charity fundraiser, Appropriate Animal Interaction   Education Outcome: Acknowledges education.    Affect/Mood: N/A   Participation Level: Did not attend    Clinical Observations/Individualized Feedback:     Plan: Continue to engage patient in RT group sessions 2-3x/week.   Makana Rostad-McCall, LRT,CTRS 06/29/2024 12:32 PM

## 2024-06-29 NOTE — Progress Notes (Signed)
(  Sleep Hours) -5.75 (Any PRNs that were needed, meds refused, or side effects to meds)- Klonopin  1mg  given @ 2147, reported sleeping @ 2300-2330, then back to sleep @ 0100-0535 (Any disturbances and when (visitation, over night)-none (Concerns raised by the patient)- anxiety 7/10, depression 5/10 (SI/HI/AVH)- Denies all

## 2024-06-29 NOTE — [facility] Suicide Risk Assessment (Signed)
 [facility] INPATIENT:  Family/Significant Other Suicide Prevention Education  Suicide Prevention Education:  Education Completed; Janiaya Ryser (father) [phone], has been identified by the patient as the family member/significant other with whom the patient will be residing, and identified as the person(s) who will aid the patient in the event of a mental health crisis (suicidal ideations/suicide attempt).  With written consent from the patient, the family member/significant other has been provided the following suicide prevention education, prior to the and/or following the discharge of the patient.  CSW spoke with Pt's father who confirmed awareness of Pt's admission however they have not spoken. (He requested Pt reach out to his wife / her mother to give correct code.) Mr. Milks shared that Pt has had mental health issues for years. Patient has a 51yo. son that he and wife have been raising since his birth. He states that Pt does not stay at home overnight often but she will come in and eat. Mr. 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 Father also reports that Zaylee has pending criminal charges with missed Court appearances. He states that she became concerned about these criminal charges and feels it contributed to her seeking support and further admission. This Clinical research associate informed father that Charmian has not shared details of Court involvement with CSW staff nor expressed concerns about missed Court dates. CSW informed father that this will be further discussed with Zimal as well as her plan for treatment moving forward post-discharge. Mr. Korte was thanked for his time and information shared.  The suicide prevention education provided includes the following: Suicide risk factors Suicide prevention and interventions National Suicide Hotline telephone number [facility] assessment telephone number [facility] Emergency Assistance [facility]  and/or Residential Mobile Crisis Unit telephone number  Request made of family/significant other to: Remove weapons (e.g., guns, rifles, knives), all items previously/currently identified as safety concern.   Remove drugs/medications (over-the-counter, prescriptions, illicit drugs), all items previously/currently identified as a safety concern.  The family member/significant other verbalizes understanding of the suicide prevention education information provided.  The family member/significant other agrees to remove the items of safety concern listed above.  Derl Abalos N Daeron Carreno, LCSW 06/29/2024, 6:14 PM

## 2024-06-29 NOTE — Group Note (Signed)
 LCSW Group Therapy Note   Group Date: 06/29/2024 Start Time: 1100 End Time: 1200   Participation:  did not attend  Type of Therapy:  Group Therapy   Topic:   Healing Flames: Navigating Anger with Compassion Objective:  Foster self-awareness and promote compassion toward oneself and others when dealing with anger.  Goals:  Help participants understand the underlying emotions and needs fueling anger. Provide coping strategies for healthier emotional expression and anger management.  Summary: This session explored anger as a volcano--an explosion driven by deeper feelings and unmet needs. Participants learned to identify anger triggers and underlying emotions, then practiced coping strategies like deep breathing, physical activity, and journaling. The group discussed healthy ways to manage anger before it escalates, using both personal reflection and shared experiences.  Therapeutic Modalities: Cognitive Behavioral Therapy (CBT): Challenging thoughts that fuel anger. Mindfulness: Increasing awareness of emotions and sensations.   Tashayla Therien O Canyon Lohr, LCSWA 06/29/2024  5:08 PM

## 2024-06-29 NOTE — [facility] Group Notes (Signed)
 The focus of this group is to help patients review their daily goal of treatment and discuss progress on daily workbooks. Pt was attentive and appropriate during tonight's wrap up group discussion. Pt was able to share she was able to talk about getting treatment for mental out and possibly of another inpatient treatment. Pt stated that she is trying to fild out what will work for her.

## 2024-06-29 NOTE — Plan of Care (Signed)
   Problem: Education: Goal: Emotional status will improve Outcome: Progressing Goal: Mental status will improve Outcome: Progressing   Problem: Activity: Goal: Interest or engagement in activities will improve Outcome: Progressing Goal: Sleeping patterns will improve Outcome: Progressing

## 2024-06-30 ENCOUNTER — Encounter (HOSPITAL_COMMUNITY): Payer: Self-pay

## 2024-06-30 DIAGNOSIS — F411 Generalized anxiety disorder: Secondary | ICD-10-CM | POA: Diagnosis not present

## 2024-06-30 DIAGNOSIS — F410 Panic disorder [episodic paroxysmal anxiety] without agoraphobia: Secondary | ICD-10-CM | POA: Diagnosis not present

## 2024-06-30 DIAGNOSIS — F329 Major depressive disorder, single episode, unspecified: Secondary | ICD-10-CM | POA: Diagnosis not present

## 2024-06-30 NOTE — Progress Notes (Signed)
   06/30/24 0800  Psych Admission Type (Psych Patients Only)  Admission Status Voluntary  Psychosocial Assessment  Patient Complaints Anxiety;Depression;Sleep disturbance  Eye Contact Intense  Facial Expression Anxious  Affect Anxious;Apprehensive  Speech Soft;Rapid;Logical/coherent  Interaction Cautious  Motor Activity Slow  Appearance/Hygiene In scrubs  Behavior Characteristics Cooperative  Mood Depressed;Anxious;Pleasant  Thought Process  Coherency WDL  Content WDL  Delusions None reported or observed  Perception Hallucinations  Hallucination Auditory  Judgment Poor  Confusion None  Danger to Self  Current suicidal ideation? Denies  Agreement Not to Harm Self Yes  Description of Agreement verbal  Danger to Others  Danger to Others None reported or observed

## 2024-06-30 NOTE — Progress Notes (Signed)
 Adult Psychoeducational Group Note  Date:  06/30/2024 Time:  10:46 PM  Group Topic/Focus:  Wrap-Up Group:   The focus of this group is to help patients review their daily goal of treatment and discuss progress on daily workbooks.  Participation Level:  Active  Participation Quality:  Appropriate  Affect:  Appropriate  Cognitive:  Appropriate  Insight: Appropriate  Engagement in Group:  Engaged  Modes of Intervention:  Discussion  Additional Comments:  Meha said she talk to her brother and she had a good conversation.The one positive thing that happen.  Lang Drilling Long 06/30/2024, 10:46 PM

## 2024-06-30 NOTE — BH IP Treatment Plan (Signed)
 Interdisciplinary Treatment and Diagnostic Plan Update  06/30/2024 Time of Session: 12:05 PM - UPDATE Latoya Schaefer MRN: [id number]  Principal Diagnosis: Panic disorder  Secondary Diagnoses: Principal Problem:   Panic disorder Active Problems:   MDD (major depressive disorder)   Generalized anxiety disorder   Current Medications:  Current Facility-Administered Medications  Medication Dose Route Frequency Provider Last Rate Last Admin   acetaminophen  (TYLENOL ) tablet 650 mg  650 mg Oral Q6H PRN Rollene Katz, MD       alum & mag hydroxide-simeth (MAALOX/MYLANTA) 200-200-20 MG/5ML suspension 30 mL  30 mL Oral Q4H PRN Rollene Katz, MD       haloperidol  (HALDOL ) tablet 5 mg  5 mg Oral TID PRN Rollene Katz, MD       And   diphenhydrAMINE  (BENADRYL ) capsule 50 mg  50 mg Oral TID PRN Rollene Katz, MD       haloperidol  lactate (HALDOL ) injection 5 mg  5 mg Intramuscular TID PRN Rollene Katz, MD       And   diphenhydrAMINE  (BENADRYL ) injection 50 mg  50 mg Intramuscular TID PRN Rollene Katz, MD       And   LORazepam  (ATIVAN ) injection 2 mg  2 mg Intramuscular TID PRN Rollene Katz, MD       haloperidol  lactate (HALDOL ) injection 10 mg  10 mg Intramuscular TID PRN Rollene Katz, MD       And   diphenhydrAMINE  (BENADRYL ) injection 50 mg  50 mg Intramuscular TID PRN Rollene Katz, MD       And   LORazepam  (ATIVAN ) injection 2 mg  2 mg Intramuscular TID PRN Rollene Katz, MD       escitalopram  (LEXAPRO ) tablet 20 mg  20 mg Oral Daily Prentis Kitchens A, DO   20 mg at 06/30/24 9246   hydrOXYzine  (ATARAX ) tablet 25 mg  25 mg Oral TID PRN Rollene Katz, MD       magnesium  hydroxide (MILK OF MAGNESIA) suspension 30 mL  30 mL Oral Daily PRN Rollene Katz, MD       QUEtiapine  (SEROQUEL ) tablet 50 mg  50 mg Oral QHS Parker, Alvin S, MD   50 mg at 06/29/24 2120   PTA Medications: Medications Prior to Admission  Medication Sig Dispense  Refill Last Dose/Taking   alprazolam  (XANAX ) 2 MG tablet Take 2 mg by mouth 2 (two) times daily as needed for anxiety.   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   Taking As Needed   escitalopram  (LEXAPRO ) 20 MG tablet Take 20 mg by mouth daily.   Taking    Patient Stressors: Substance abuse    Patient Strengths: Manufacturing systems engineer  Supportive family/friends   Treatment Modalities: Medication Management, Group therapy, Case management,  1 to 1 session with clinician, Psychoeducation, Recreational therapy.   Physician Treatment Plan for Primary Diagnosis: Panic disorder Long Term Goal(s): Improvement in symptoms so as ready for discharge   Short Term Goals: Ability to identify changes in lifestyle to reduce recurrence of condition will improve Ability to identify and develop effective coping behaviors will improve Ability to maintain clinical measurements within normal limits will improve Compliance with prescribed medications will improve  Medication Management: Evaluate patient's response, side effects, and tolerance of medication regimen.  Therapeutic Interventions: 1 to 1 sessions, Unit Group sessions and Medication administration.  Evaluation of Outcomes: Progressing  Physician Treatment Plan for Secondary Diagnosis: Principal Problem:  Panic disorder Active Problems:   MDD (major depressive disorder)   Generalized anxiety disorder  Long Term Goal(s): Improvement in symptoms so as ready for discharge   Short Term Goals: Ability to identify changes in lifestyle to reduce recurrence of condition will improve Ability to identify and develop effective coping behaviors will improve Ability to maintain clinical measurements within normal limits will improve Compliance with prescribed medications will improve     Medication Management: Evaluate patient's response, side effects,  and tolerance of medication regimen.  Therapeutic Interventions: 1 to 1 sessions, Unit Group sessions and Medication administration.  Evaluation of Outcomes: Progressing   RN Treatment Plan for Primary Diagnosis: Panic disorder Long Term Goal(s): Knowledge of disease and therapeutic regimen to maintain health will improve  Short Term Goals: Ability to remain free from injury will improve, Ability to verbalize frustration and anger appropriately will improve, Ability to verbalize feelings will improve, and Ability to disclose and discuss suicidal ideas  Medication Management: RN will administer medications as ordered by provider, will assess and evaluate patient's response and provide education to patient for prescribed medication. RN will report any adverse and/or side effects to prescribing provider.  Therapeutic Interventions: 1 on 1 counseling sessions, Psychoeducation, Medication administration, Evaluate responses to treatment, Monitor vital signs and CBGs as ordered, Perform/monitor CIWA, COWS, AIMS and Fall Risk screenings as ordered, Perform wound care treatments as ordered.  Evaluation of Outcomes: Progressing   LCSW Treatment Plan for Primary Diagnosis: Panic disorder Long Term Goal(s): Safe transition to appropriate next level of care at discharge, Engage patient in therapeutic group addressing interpersonal concerns.  Short Term Goals: Engage patient in aftercare planning with referrals and resources, Increase ability to appropriately verbalize feelings, Facilitate acceptance of mental health diagnosis and concerns, and Identify triggers associated with mental health/substance abuse issues  Therapeutic Interventions: Assess for all discharge needs, 1 to 1 time with Social worker, Explore available resources and support systems, Assess for adequacy in community support network, Educate family and significant other(s) on suicide prevention, Complete Psychosocial Assessment,  Interpersonal group therapy.  Evaluation of Outcomes: Progressing   Progress in Treatment: Attending groups: No Participating in groups: No Taking medication as prescribed: Yes. Toleration medication: Yes. Family/Significant other contact made:  [name] (father) [phone]  Patient understands diagnosis: Yes. Discussing patient identified problems/goals with staff: Yes. Medical problems stabilized or resolved: Yes. Denies suicidal/homicidal ideation: Yes. Issues/concerns per patient self-inventory: No.     Patient Goals:  Stabilize on anxiety medications   Discharge Plan or Barriers: Severe anxiety; suicidal ideation   Reason for Continuation of Hospitalization: Suicidal ideation   Estimated Length of Stay:  4 - 6 days   Last 3 [place] Suicide Severity Risk Score: Flowsheet Row [facility]Current[facility] Most recent reading at 06/24/2024  4:00 PM ED from 06/24/2024 in [facility] Most recent reading at 06/24/2024  5:38 AM  C-SSRS RISK CATEGORY No Risk No Risk    Last PHQ 2/9 Scores:     No data to display          Scribe for Treatment Team: Zissel Biederman O Macallister Ashmead, LCSWA 06/30/2024 4:50 PM

## 2024-06-30 NOTE — Group Note (Signed)
 Date:  06/30/2024 Time:  10:01 AM  Group Topic/Focus:  Goals Group:   The focus of this group is to help patients establish daily goals to achieve during treatment and discuss how the patient can incorporate goal setting into their daily lives to aide in recovery.    Participation Level:  Did Not Attend  Participation Quality:    Affect:    Cognitive:    Insight:   Engagement in Group:    Modes of Intervention:    Additional Comments:  Pt were advised of group time pt refused to attend.  Latoya Schaefer 06/30/2024, 10:01 AM

## 2024-06-30 NOTE — BHH Group Notes (Signed)

## 2024-06-30 NOTE — Progress Notes (Signed)
   06/30/24 2110  Psych Admission Type (Psych Patients Only)  Admission Status Voluntary  Psychosocial Assessment  Patient Complaints Anxiety  Eye Contact Fair  Facial Expression Anxious  Affect Apprehensive;Anxious  Speech Soft  Interaction Evasive  Motor Activity Other (Comment) (WNL)  Behavior Characteristics Appropriate to situation  Mood Anxious  Thought Process  Coherency WDL  Content Confabulation  Delusions None reported or observed  Perception Hallucinations  Hallucination Auditory  Judgment Poor  Confusion None  Danger to Self  Current suicidal ideation?  (Denies)  Agreement Not to Harm Self Yes  Description of Agreement Notify Staff.  Danger to Others  Danger to Others None reported or observed

## 2024-06-30 NOTE — BH Assessment (Signed)
(  Sleep Hours) - 7.5 (Any PRNs that were needed, meds refused, or side effects to meds)- None. (Any disturbances and when (visitation, over night)- None (Concerns raised by the patient)- Request assistance from the social worker for facility for mental and not polysubstance. (SI/HI/AVH)-Denies SI/HI; V/H confirms: A/H

## 2024-06-30 NOTE — Plan of Care (Signed)
  Problem: Education: Goal: Knowledge of [place] [facility] Education information/materials will improve Outcome: Progressing Goal: Emotional status will improve Outcome: Progressing Goal: Mental status will improve Outcome: Progressing Goal: Verbalization of understanding the information provided will improve Outcome: Progressing   Problem: Activity: Goal: Interest or engagement in activities will improve Outcome: Progressing   Problem: Coping: Goal: Ability to verbalize frustrations and anger appropriately will improve Outcome: Progressing Goal: Ability to demonstrate self-control will improve Outcome: Progressing   Problem: Health Behavior/Discharge Planning: Goal: Identification of resources available to assist in meeting health care needs will improve Outcome: Progressing Goal: Compliance with treatment plan for underlying cause of condition will improve Outcome: Progressing   Problem: Physical Regulation: Goal: Ability to maintain clinical measurements within normal limits will improve Outcome: Progressing   Problem: Safety: Goal: Periods of time without injury will increase Outcome: Progressing

## 2024-06-30 NOTE — Progress Notes (Signed)
   06/29/24 2150  Psych Admission Type (Psych Patients Only)  Admission Status Voluntary  Psychosocial Assessment  Patient Complaints Anxiety  Eye Contact Glaring;Intense  Facial Expression Anxious  Affect Anxious;Apprehensive  Speech Soft;Rapid  Interaction Cautious  Motor Activity Other (Comment) (WNL)  Appearance/Hygiene Disheveled;Poor hygiene  Behavior Characteristics Appropriate to situation  Mood Anxious  Thought Process  Coherency WDL  Content Paranoia  Delusions None reported or observed  Perception Hallucinations  Hallucination Auditory  Judgment Poor  Confusion Mild  Danger to Self  Current suicidal ideation?  (Denies)  Agreement Not to Harm Self Yes  Description of Agreement Notify Staff.  Danger to Others  Danger to Others None reported or observed

## 2024-06-30 NOTE — Plan of Care (Signed)
?  Problem: Education: Goal: Emotional status will improve Outcome: Progressing Goal: Verbalization of understanding the information provided will improve Outcome: Progressing   Problem: Safety: Goal: Periods of time without injury will increase Outcome: Progressing   

## 2024-06-30 NOTE — Group Note (Signed)
 Recreation Therapy Group Note   Group Topic:Other  Group Date: 06/30/2024 Start Time: 1410 End Time: 1500 Facilitators: Hutch Rhett-McCall, LRT,CTRS Location: [address] Dayroom   Activity Description/Intervention: Therapeutic Drumming. Patients with peers and staff were given the opportunity to engage in a leader facilitated [facility] Empowerment Drumming Circle with staff from the [employer], in partnership with [employer]. Teaching laboratory technician and trained [employer], Norleen Mon leading with LRT observing and documenting intervention and pt response. This evidenced-based practice targets 7 areas of health and wellbeing in the human experience including: stress-reduction, exercise, self-expression, camaraderie/support, nurturing, spirituality, and music-making (leisure).   Goal Area(s) Addresses:  Patient will engage in pro-social way in music group.  Patient will follow directions of drum leader on the first prompt. Patient will demonstrate no behavioral issues during group.  Patient will identify if a reduction in stress level occurs as a result of participation in therapeutic drum circle.    Education: Leisure exposure, Pharmacologist, Musical expression, Discharge Planning   Affect/Mood: N/A   Participation Level: Did not attend    Clinical Observations/Individualized Feedback:      Plan: Continue to engage patient in RT group sessions 2-3x/week.   Othal Kubitz-McCall, LRT,CTRS 06/30/2024 3:39 PM

## 2024-07-01 DIAGNOSIS — F329 Major depressive disorder, single episode, unspecified: Secondary | ICD-10-CM | POA: Diagnosis not present

## 2024-07-01 DIAGNOSIS — F410 Panic disorder [episodic paroxysmal anxiety] without agoraphobia: Secondary | ICD-10-CM | POA: Diagnosis not present

## 2024-07-01 DIAGNOSIS — F1994 Other psychoactive substance use, unspecified with psychoactive substance-induced mood disorder: Secondary | ICD-10-CM | POA: Insufficient documentation

## 2024-07-01 DIAGNOSIS — F411 Generalized anxiety disorder: Secondary | ICD-10-CM | POA: Diagnosis not present

## 2024-07-01 NOTE — Group Note (Signed)
 LCSW Group Therapy Note   Group Date: 07/01/2024 Start Time: 1100 End Time: 1200   Participation:  did not attend  Type of Therapy:  Group Therapy  Topic:  Shining from Within:  Confidence and Self-Love Journey  Objective:  To support participants in developing confidence and self-love through self-awareness, self-compassion, and practical skills that nurture personal growth.   Group Goals Encourage self-reflection and self-acceptance by identifying personal strengths and achievements. Teach skills to challenge negative self-talk and replace it with supportive, truthful self-talk. Foster resilience and self-worth through [employer], gratitude, and self-care practices.   Summary:  This group explores the connection between confidence and self-love by guiding participants through reflection, mindset shifts, and practical tools like affirmations, strength recognition, and goal-setting. Activities are designed to promote self-compassion, build emotional resilience, and normalize the slow, patient journey of inner growth.   Therapeutic Modalities Used Cognitive Behavioral Therapy (CBT): Challenging and reframing unhelpful self-talk. Motivational Interviewing (MI): Encouraging small, achievable goals. Elements of Dialectical Behavioral Therapist (DBT):  Mindfulness and Self-Compassion: Promoting present-moment awareness and kindness toward self.   Dixon Luczak O Cordelia Bessinger, LCSWA 07/01/2024  4:59 PM

## 2024-07-01 NOTE — Progress Notes (Signed)
 [facility] MD Progress Note  07/01/2024 3:19 PM Latoya Schaefer  MRN:  [id number] Subjective:   53 year old Caucasian female, unemployed, lives with her family, recently incarcerated for failure to appear.  Background history of substance use disorder, trauma and PTSD.  Self-presented to behavioral urgent care reporting increasing anxiety and depression.  Intoxicated with methamphetamine at presentation.  I assumed care of this patient today.  Chart reviewed today.  Patient discussed that multidisciplinary team meeting.  Nursing staff reports that patient has been isolative in her room.  She is not attending unit groups or therapeutic activities.  She has been persistently taking clonazepam  for subjective anxiety.  She slept for 7.5 hours.  Social worker reports that collateral from her father indicates that her substance use is a major issue.  Seen today.  Patient is very circumstantial and very vague.  Talked about issues that have happened in her life years ago.  Not endorsing any nightmares.  Not endorsing any other features of PTSD.  Very preoccupied with clonazepam  as she feels that is the only medication that has helped her.  Concerns about tolerance and dependence especially in view of her history of addiction was shared with her.  Encouraged her to use groups and therapeutic activities as a means of learning how to cope with anxiety.   Principal Problem: Panic disorder Diagnosis: Principal Problem:   Panic disorder Active Problems:   MDD (major depressive disorder)   Generalized anxiety disorder  Total Time spent with patient: 30 minutes  Past Psychiatric History:  See H&P  Past Medical History:  Past Medical History:  Diagnosis Date   Anxiety    Depression    History reviewed. No pertinent surgical history. Family History: History reviewed. No pertinent family history. Family Psychiatric  History:  See H&P  Social History:  Social History   Substance and Sexual Activity   Alcohol Use No     Social History   Substance and Sexual Activity  Drug Use No    Social History   Socioeconomic History   Marital status: Single    Spouse name: Not on file   Number of children: Not on file   Years of education: Not on file   Highest education level: Not on file  Occupational History   Not on file  Tobacco Use   Smoking status: Never   Smokeless tobacco: Never  Vaping Use   Vaping status: Not on file  Substance and Sexual Activity   Alcohol use: No   Drug use: No   Sexual activity: Not on file  Other Topics Concern   Not on file  Social History Narrative   Not on file   Social Drivers of Health   Financial Resource Strain: Not on file  Food Insecurity: No Food Insecurity (06/24/2024)   Hunger Vital Sign    Worried About Running Out of Food in the Last Year: Never true    Ran Out of Food in the Last Year: Never true  Transportation Needs: No Transportation Needs (06/24/2024)   PRAPARE - Administrator, Civil Service (Medical): No    Lack of Transportation (Non-Medical): No  Physical Activity: Not on file  Stress: Not on file  Social Connections: Not on file    Current Medications: Current Facility-Administered Medications  Medication Dose Route Frequency Provider Last Rate Last Admin   acetaminophen  (TYLENOL ) tablet 650 mg  650 mg Oral Q6H PRN Rollene Katz, MD       alum & mag hydroxide-simeth (MAALOX/MYLANTA)  200-200-20 MG/5ML suspension 30 mL  30 mL Oral Q4H PRN Rollene Katz, MD       haloperidol  (HALDOL ) tablet 5 mg  5 mg Oral TID PRN Rollene Katz, MD       And   diphenhydrAMINE  (BENADRYL ) capsule 50 mg  50 mg Oral TID PRN Rollene Katz, MD       haloperidol  lactate (HALDOL ) injection 5 mg  5 mg Intramuscular TID PRN Rollene Katz, MD       And   diphenhydrAMINE  (BENADRYL ) injection 50 mg  50 mg Intramuscular TID PRN Rollene Katz, MD       And   LORazepam  (ATIVAN ) injection 2 mg  2 mg  Intramuscular TID PRN Rollene Katz, MD       haloperidol  lactate (HALDOL ) injection 10 mg  10 mg Intramuscular TID PRN Rollene Katz, MD       And   diphenhydrAMINE  (BENADRYL ) injection 50 mg  50 mg Intramuscular TID PRN Rollene Katz, MD       And   LORazepam  (ATIVAN ) injection 2 mg  2 mg Intramuscular TID PRN Rollene Katz, MD       escitalopram  (LEXAPRO ) tablet 20 mg  20 mg Oral Daily Prentis Kitchens A, DO   20 mg at 07/01/24 9268   hydrOXYzine  (ATARAX ) tablet 25 mg  25 mg Oral TID PRN Rollene Katz, MD       magnesium  hydroxide (MILK OF MAGNESIA) suspension 30 mL  30 mL Oral Daily PRN Rollene Katz, MD       QUEtiapine  (SEROQUEL ) tablet 50 mg  50 mg Oral QHS Parker, Alvin S, MD   50 mg at 06/30/24 2142    Lab Results: No results found for this or any previous visit (from the past 48 hours).  Blood Alcohol level:  Lab Results  Component Value Date   [facility] <15 06/24/2024    Metabolic Disorder Labs: Lab Results  Component Value Date   HGBA1C 5.2 06/29/2024   MPG 102.54 06/29/2024   No results found for: PROLACTIN Lab Results  Component Value Date   CHOL 189 06/29/2024   TRIG 86 06/29/2024   HDL 58 06/29/2024   CHOLHDL 3.3 06/29/2024   VLDL 17 06/29/2024   LDLCALC 114 (H) 06/29/2024    Physical Findings: AIMS:  ,  ,  ,  ,  ,  ,   CIWA:    COWS:     Musculoskeletal: Strength & Muscle Tone: within normal limits Gait & Station: normal Patient leans: N/A  Psychiatric Specialty Exam:  Presentation  General Appearance and behavior:  Limited grooming, in bed, moderate rapport.  No EPS.  Eye Contact: Good.  Speech: Spontaneous.  Not pressured or loud.  Mood and Affect  Mood: Subjective anxiety.  Not subjectively anxious.  Affect: Restricted and appropriate.   Thought Process  Thought Processes: Circumstantial  Descriptions of Associations:Intact  Orientation:Full (Time, Place and Person)  Thought Content: No current  suicidal thoughts.  No homicidal thoughts.  No thoughts of violence.  No negative ruminative flooding.  No guilty ruminations.  No delusional theme.  No obsessions.  Hallucinations: No hallucination in any modality.  Sensorium  Memory: Good.  Judgment: Good.  Insight: Limited as she does not want to deal with her addiction.  Executive Functions  Concentration: Good.  Attention Span: Good.  Recall: Good.  Fund of Knowledge: Good.  Language: Good   Psychomotor Activity  Normal psychomotor activity    Physical Exam: Physical Exam ROS Blood pressure (!) 92/58,  pulse (!) 59, temperature 97.7 F (36.5 C), temperature source Oral, resp. rate 16, height 5' 7 (1.702 m), weight 68.9 kg, SpO2 100%. Body mass index is 23.81 kg/m.   Treatment Plan Summary: Patient is tolerating her medications well.  She seems to be very focused on clonazepam .  She is not endorsing any depression.  No dangerousness.  Feedback from family indicates substance use disorder is a major issue.  We will discontinue as needed clonazepam  and keep her other medicines the same.  We will evaluate her further.  1.  Discontinue clonazepam  as needed. 2.  Continue escitalopram  5 mg at bedtime. 3.  Continue quetiapine  50 mg at bedtime. 4.  Continue to encourage unit groups and therapeutic activities. 5.  Continue to monitor mood behavior and interaction with others. 6.  Continue to motivate patient towards addiction treatment. 7.  Social worker will coordinate discharge and aftercare planning.  Jerrell DELENA Forehand, MD 07/01/2024, 3:19 PM

## 2024-07-01 NOTE — Plan of Care (Signed)
  Problem: Education: Goal: Knowledge of [facility] Education information/materials will improve Outcome: Progressing Goal: Emotional status will improve Outcome: Progressing Goal: Mental status will improve Outcome: Progressing Goal: Verbalization of understanding the information provided will improve Outcome: Progressing   Problem: Activity: Goal: Interest or engagement in activities will improve Outcome: Progressing   Problem: Health Behavior/Discharge Planning: Goal: Identification of resources available to assist in meeting health care needs will improve Outcome: Progressing Goal: Compliance with treatment plan for underlying cause of condition will improve Outcome: Progressing

## 2024-07-01 NOTE — Progress Notes (Signed)
   07/01/24 2110  Psych Admission Type (Psych Patients Only)  Admission Status Voluntary  Psychosocial Assessment  Patient Complaints Nervousness  Eye Contact Fair  Facial Expression Worried  Affect Appropriate to circumstance;Preoccupied  Speech Soft  Interaction Cautious;Minimal  Motor Activity Other (Comment) (WNL)  Appearance/Hygiene In scrubs  Behavior Characteristics Appropriate to situation;Calm  Mood Apprehensive  Thought Process  Coherency WDL  Content Blaming others  Delusions None reported or observed  Perception WDL  Hallucination None reported or observed  Judgment Limited  Confusion None  Danger to Self  Current suicidal ideation?  (Denies)  Agreement Not to Harm Self Yes  Description of Agreement Notify Staff  Danger to Others  Danger to Others None reported or observed   Pt A/O x4 with noted MDD/Polysubstance. Pt able to verbalize needs. Pt concerned upon D/C not prepared on mental health facility. Pt said; I don't have a problem with Meth. I need assistance with mental issues. I need to stay somewhere for a couple more weeks and focus on mental issues. Pt was educated that mental health was outpatient. And if she used a Provider in the past that assisted her, to reach out and seek services if possible. Pt also given a journal to write down needs and convey concerns to Child psychotherapist. Pt acknowledge understanding. Pt denied SI/HI; A/V/H. Pt has been isolative during 7p-7a shift. No noted distress. Continuing to monitor during 7p-7a shift.

## 2024-07-01 NOTE — BH Assessment (Signed)
(  Sleep Hours) - (Any PRNs that were needed, meds refused, or side effects to meds)- None (Any disturbances and when (visitation, over night)- None (Concerns raised by the patient)- Pt A/O x4 with noted MDD/Polysubstance. Pt able to verbalize needs. Pt concerned upon D/C not prepared on mental health facility. Pt said; I don't have a problem with Meth. I need assistance with mental issues. I need to stay somewhere for a couple more weeks and focus on mental issues. Pt was educated that mental health was outpatient. And if she used a Provider in the past that assisted her, to reach out and seek services if possible. Pt also given a journal to write down needs and convey concerns to Child psychotherapist. Pt acknowledge understanding. Pt denied SI/HI; A/V/H. Pt has been isolative during 7p-7a shift. No noted distress. Continuing to monitor during 7p-7a shift.  (SI/HI/AVH)- Denies

## 2024-07-01 NOTE — Progress Notes (Signed)
   07/01/24 0900  Psych Admission Type (Psych Patients Only)  Admission Status Voluntary  Psychosocial Assessment  Patient Complaints Anxiety;Depression  Eye Contact Fair  Facial Expression Anxious  Affect Apprehensive;Anxious  Speech Soft  Interaction Cautious;Guarded  Motor Activity Slow  Appearance/Hygiene Unremarkable  Behavior Characteristics Appropriate to situation  Mood Anxious  Thought Process  Coherency WDL  Content WDL  Delusions None reported or observed  Perception WDL  Hallucination None reported or observed  Judgment Impaired  Confusion None  Danger to Self  Current suicidal ideation? Denies  Agreement Not to Harm Self Yes  Description of Agreement verbal  Danger to Others  Danger to Others None reported or observed

## 2024-07-01 NOTE — BH Assessment (Signed)
(  Sleep Hours) - 10.25 (Any PRNs that were needed, meds refused, or side effects to meds)- None (Any disturbances and when (visitation, over night)- None (Concerns raised by the patient)- Pt concerned her med: Clonazepam  was D/C. She says the Vistaril  does not assist with her anxiety. (SI/HI/AVH)- Denied

## 2024-07-01 NOTE — Plan of Care (Signed)
  Problem: Education: Goal: Emotional status will improve Outcome: Progressing Goal: Verbalization of understanding the information provided will improve Outcome: Progressing   Problem: Activity: Goal: Sleeping patterns will improve Outcome: Progressing   Problem: Coping: Goal: Ability to demonstrate self-control will improve Outcome: Progressing

## 2024-07-01 NOTE — Group Note (Signed)
 Date:  07/01/2024 Time:  9:29 PM  Group Topic/Focus:  Wrap-Up Group:   The focus of this group is to help patients review their daily goal of treatment and discuss progress on daily workbooks.    Participation Level:  Active  Participation Quality:  Appropriate  Affect:  Appropriate  Cognitive:  Appropriate  Insight: Appropriate  Engagement in Group:  Engaged  Modes of Intervention:  Education and Exploration  Additional Comments:  Patient attended and participated in group tonight.  She reports that her goal was to get resources. She did not meet her goal.  Latoya Schaefer 07/01/2024, 9:29 PM

## 2024-07-02 DIAGNOSIS — F411 Generalized anxiety disorder: Secondary | ICD-10-CM | POA: Diagnosis not present

## 2024-07-02 DIAGNOSIS — F410 Panic disorder [episodic paroxysmal anxiety] without agoraphobia: Secondary | ICD-10-CM | POA: Diagnosis not present

## 2024-07-02 DIAGNOSIS — F329 Major depressive disorder, single episode, unspecified: Secondary | ICD-10-CM | POA: Diagnosis not present

## 2024-07-02 DIAGNOSIS — F1994 Other psychoactive substance use, unspecified with psychoactive substance-induced mood disorder: Secondary | ICD-10-CM

## 2024-07-02 MED ORDER — HYDROXYZINE HCL 25 MG PO TABS: 25.0000 mg | ORAL_TABLET | Freq: Three times a day (TID) | ORAL | 0 refills | Status: AC | PRN

## 2024-07-02 MED ORDER — QUETIAPINE FUMARATE 50 MG PO TABS: 50.0000 mg | ORAL_TABLET | Freq: Every day | ORAL | 0 refills | Status: AC

## 2024-07-02 MED ORDER — ESCITALOPRAM OXALATE 20 MG PO TABS: 20.0000 mg | ORAL_TABLET | Freq: Every day | ORAL | 0 refills | Status: AC

## 2024-07-02 NOTE — Group Note (Signed)
 Recreation Therapy Group Note   Group Topic:Problem Solving  Group Date: 07/02/2024 Start Time: 0930 End Time: 1005 Facilitators: Natalija Mavis-McCall, LRT,CTRS Location: [address] Day[address]   Group Topic: Communication, Team Building, Problem Solving  Goal Area(s) Addresses:  Patient will effectively work with peer towards shared goal.  Patient will identify skills used to make activity successful.  Patient will identify how skills used during activity can be used to reach post d/c goals.   Behavioral Response:   Intervention: STEM Activity  Activity: Straw Bridge. In teams of 3-5, patients were given 15 plastic drinking straws and an equal length of masking tape. Using the materials provided, patients were instructed to build a free standing bridge-like structure to suspend an everyday item (ex: puzzle box) off of the floor or table surface. All materials were required to be used by the team in their design. LRT facilitated post-activity discussion reviewing team process. Patients were encouraged to reflect how the skills used in this activity can be generalized to daily life post discharge.   Education: Pharmacist, community, Scientist, physiological, Discharge Planning   Education Outcome: Acknowledges education/In group clarification offered/Needs additional education.    Affect/Mood: N/A   Participation Level: Did not attend    Clinical Observations/Individualized Feedback:      Plan: Continue to engage patient in RT group sessions 2-3x/week.   Rosalva Neary-McCall, LRT,CTRS 07/02/2024 12:23 PM

## 2024-07-02 NOTE — Group Note (Signed)
 Date:  07/02/2024 Time:  9:32 AM  Group Topic/Focus:  Goals Group:   The focus of this group is to help patients establish daily goals to achieve during treatment and discuss how the patient can incorporate goal setting into their daily lives to aide in recovery. Healthy Communication:   The focus of this group is to discuss communication, barriers to communication, as well as healthy ways to communicate with others.   Pt did not attend group.   Kristina Mcnorton A Lincy Belles 07/02/2024, 9:32 AM

## 2024-07-02 NOTE — Progress Notes (Signed)
 Patient verbalizes readiness for discharge. All patient belongings returned to patient. Discharge instructions read and discussed with patient (appointments, medications, resources). Patient expressed gratitude for care provided. Patient discharged to lobby at 1545 where her ride was waiting.

## 2024-07-02 NOTE — Plan of Care (Signed)

## 2024-07-02 NOTE — Progress Notes (Signed)
  [facility] Adult Case Management Discharge Plan :  Will you be returning to the same living situation after discharge:  Yes,  [address] [place]   At discharge, do you have transportation home?: No. Do you have the ability to pay for your medications: Yes,  The patient stated yes, she has Medicaid.   Release of information consent forms completed and in the chart;  Patient's signature needed at discharge.  Patient to Follow up at:  Follow-up Information     [facility] Go on 07/05/2024.   Specialty: Professional Counselor Why: Please go to this provider on 07/05/24 at 9:00 am for an assessment, to receive therapy services. You may also go Monday through Friday, from 9 am to 1 pm. Contact information: [address]  [address] [place] [address] [phone]         [facility]. Go on 07/06/2024.   Specialty: Behavioral Health Why: Please go to this provider on 07/06/24 at 7:00 am for an assessment, to receive medication management services. You may also go Monday through Friday, arrive by 7:00 am for an assessment. Contact information: [address] [place]  [phone]                Next level of care provider has access to [facility] Link:yes  Safety Planning and Suicide Prevention discussed: Yes,  Hedy Garro (father) [phone],     Has patient been referred to the Quitline?: Patient refused referral for treatment  Patient has been referred for addiction treatment: Yes, the patient will follow up with an outpatient provider for substance use disorder. Psychiatrist/APP: appointment made and Therapist: appointment made  Roselyn GORMAN Lento, LCSW 07/02/2024, 9:10 AM

## 2024-07-02 NOTE — Progress Notes (Signed)
   07/02/24 0900  Psych Admission Type (Psych Patients Only)  Admission Status Voluntary  Psychosocial Assessment  Patient Complaints Nervousness  Eye Contact Fair  Facial Expression Worried  Affect Appropriate to circumstance;Preoccupied  Speech Soft  Interaction Cautious;Minimal  Motor Activity Slow  Appearance/Hygiene Unremarkable  Behavior Characteristics Appropriate to situation  Mood Apprehensive  Thought Process  Coherency WDL  Content Blaming others  Delusions None reported or observed  Perception WDL  Hallucination None reported or observed  Judgment Limited  Confusion None  Danger to Self  Current suicidal ideation? Denies  Description of Suicide Plan No Plan  Agreement Not to Harm Self Yes  Description of Agreement Verbal  Danger to Others  Danger to Others None reported or observed

## 2024-07-02 NOTE — BHH Suicide Risk Assessment (Signed)
 [facility] Discharge Suicide Risk Assessment   Principal Problem: MDD (major depressive disorder) Discharge Diagnoses: Principal Problem:   MDD (major depressive disorder) Active Problems:   Panic disorder   Generalized anxiety disorder   Psychoactive substance-induced organic mood disorder (HCC)   Total Time spent with patient: 45 minutes  Musculoskeletal: Strength & Muscle Tone: within normal limits Gait & Station: normal Patient leans: N/A  Psychiatric Specialty Exam  Presentation  General Appearance:  Casually dressed, not in any distress, appropriate behavior.  No EPS.  Eye Contact: Good.  Speech: Spontaneous.  Normal rate, tone and volume.  Normal prosody of speech.  Mood and Affect  Mood: Euthymic.  Affect: Full range and appropriate.  Thought Process  Thought Processes: Linear and goal directed.  Descriptions of Associations:Intact  Orientation:Full (Time, Place and Person)  Thought Content: Future oriented.  No current suicidal thoughts.  No homicidal thoughts.  No thoughts of violence.  No negative ruminative flooding.  No guilty ruminations.  No delusional theme.  No obsessions.  Hallucinations: No hallucination in any modality.  Sensorium  Memory: Good.  Judgment: Good.  Insight: Good  Executive Functions  Concentration: Good.  Attention Span: Good.  Recall: Good.  Fund of Knowledge: Good.  Language: Good   Psychomotor Activity  Normal psychomotor activity   Physical Exam: Physical Exam ROS Blood pressure (!) 117/45, pulse 80, temperature 97.9 F (36.6 C), temperature source Oral, resp. rate 16, height 5' 7 (1.702 m), weight 68.9 kg, SpO2 100%. Body mass index is 23.81 kg/m.  Mental Status Per Nursing Assessment::   On Admission:  NA  Demographic Factors:  Caucasian  Loss Factors: Legal issues  Historical Factors: Prior suicide attempts, Family history of mental illness or substance abuse, and  Impulsivity  Risk Reduction Factors:   Living with another person, especially a relative  Continued Clinical Symptoms:  No evidence of depression.  No evidence of mania.  No evidence of psychosis.  No acute PTSD symptoms.  No overwhelming anxiety.  Cognitive Features That Contribute To Risk:  None    Suicide Risk:  Minimal: No current suicidal thoughts.  No current homicidal thoughts.  No current thoughts of violence.  Patient is tolerating her medications well.  Unfortunately she is not motivated to seek addiction treatment.  Patient is aware of risk of accidental suicide from previously tolerated opioids.  We have encouraged her to abstain from psychoactive substances. At this point in time.  Patient is not a danger to self or others.  Patient is stable for care at the lower setting.  Follow-up Information     [place] Go on 07/05/2024.   Specialty: Professional Counselor Why: Please go to this provider on 07/05/24 at 9:00 am for an assessment, to receive therapy services. You may also go Monday through Friday, from 9 am to 1 pm. Contact information: [address]  [address] [place] [address] [phone]         [facility]. Go on 07/06/2024.   Specialty: Behavioral Health Why: Please go to this provider on 07/06/24 at 7:00 am for an assessment, to receive medication management services. You may also go Monday through Friday, arrive by 7:00 am for an assessment. Contact information: [address] [place]  [phone]                Plan Of Care/Follow-up recommendations:  See discharge summary  Jerrell DELENA Forehand, MD 07/02/2024, 9:59 AM

## 2024-07-02 NOTE — Progress Notes (Signed)
 [facility] MD Progress Note  07/02/2024 9:50 AM Latoya Schaefer  MRN:  [id number] Subjective:   53 year old Caucasian female, unemployed, lives with her family, recently incarcerated for failure to appear.  Background history of substance use disorder, trauma and PTSD.  Self-presented to behavioral urgent care reporting increasing anxiety and depression.  Intoxicated with methamphetamine at presentation.   Chart reviewed today.  Patient discussed that multidisciplinary team meeting.  Nursing staff reports that patient is not engaging with unit groups or therapeutic activities.  She slept well last night.  No observed panic attacks.  No challenging behavior.  She has been adherent with her medications.  Social worker obtain feedback and collateral from family.  No concerns about dangerousness.  Patient has some pending legal charges in the community.  Seen today.  Very focused on being prescribed benzodiazepines.  States that she might have to seek out her previous provider in the community whom she had not seen for over 2 years.  Not endorsing any worries or any concerns.  Remains very circumstantial and focused on controlled substances.  No evidence of mania.  No evidence of psychosis.  Not endorsing any rageful thoughts towards herself or towards anyone else.  No evidence of PTSD.   Principal Problem: MDD (major depressive disorder) Diagnosis: Principal Problem:   MDD (major depressive disorder) Active Problems:   Panic disorder   Generalized anxiety disorder   Psychoactive substance-induced organic mood disorder (HCC)  Total Time spent with patient: 30 minutes  Past Psychiatric History:  See H&P  Past Medical History:  Past Medical History:  Diagnosis Date   Anxiety    Depression    History reviewed. No pertinent surgical history. Family History: History reviewed. No pertinent family history. Family Psychiatric  History:  See H&P  Social History:  Social History   Substance and  Sexual Activity  Alcohol Use No     Social History   Substance and Sexual Activity  Drug Use No    Social History   Socioeconomic History   Marital status: Single    Spouse name: Not on file   Number of children: Not on file   Years of education: Not on file   Highest education level: Not on file  Occupational History   Not on file  Tobacco Use   Smoking status: Never   Smokeless tobacco: Never  Vaping Use   Vaping status: Not on file  Substance and Sexual Activity   Alcohol use: No   Drug use: No   Sexual activity: Not on file  Other Topics Concern   Not on file  Social History Narrative   Not on file   Social Drivers of Health   Financial Resource Strain: Not on file  Food Insecurity: No Food Insecurity (06/24/2024)   Hunger Vital Sign    Worried About Running Out of Food in the Last Year: Never true    Ran Out of Food in the Last Year: Never true  Transportation Needs: No Transportation Needs (06/24/2024)   PRAPARE - Administrator, Civil Service (Medical): No    Lack of Transportation (Non-Medical): No  Physical Activity: Not on file  Stress: Not on file  Social Connections: Not on file    Current Medications: Current Facility-Administered Medications  Medication Dose Route Frequency Provider Last Rate Last Admin   acetaminophen  (TYLENOL ) tablet 650 mg  650 mg Oral Q6H PRN Rollene Katz, MD       alum & mag hydroxide-simeth (MAALOX/MYLANTA) 200-200-20 MG/5ML suspension  30 mL  30 mL Oral Q4H PRN Rollene Katz, MD       haloperidol  (HALDOL ) tablet 5 mg  5 mg Oral TID PRN Rollene Katz, MD       And   diphenhydrAMINE  (BENADRYL ) capsule 50 mg  50 mg Oral TID PRN Rollene Katz, MD       haloperidol  lactate (HALDOL ) injection 5 mg  5 mg Intramuscular TID PRN Rollene Katz, MD       And   diphenhydrAMINE  (BENADRYL ) injection 50 mg  50 mg Intramuscular TID PRN Rollene Katz, MD       And   LORazepam  (ATIVAN ) injection 2 mg   2 mg Intramuscular TID PRN Rollene Katz, MD       haloperidol  lactate (HALDOL ) injection 10 mg  10 mg Intramuscular TID PRN Rollene Katz, MD       And   diphenhydrAMINE  (BENADRYL ) injection 50 mg  50 mg Intramuscular TID PRN Rollene Katz, MD       And   LORazepam  (ATIVAN ) injection 2 mg  2 mg Intramuscular TID PRN Rollene Katz, MD       escitalopram  (LEXAPRO ) tablet 20 mg  20 mg Oral Daily Prentis Kitchens A, DO   20 mg at 07/02/24 9148   hydrOXYzine  (ATARAX ) tablet 25 mg  25 mg Oral TID PRN Rollene Katz, MD       magnesium  hydroxide (MILK OF MAGNESIA) suspension 30 mL  30 mL Oral Daily PRN Rollene Katz, MD       QUEtiapine  (SEROQUEL ) tablet 50 mg  50 mg Oral QHS Parker, Alvin S, MD   50 mg at 07/01/24 2110    Lab Results: No results found for this or any previous visit (from the past 48 hours).  Blood Alcohol level:  Lab Results  Component Value Date   [facility] <15 06/24/2024    Metabolic Disorder Labs: Lab Results  Component Value Date   HGBA1C 5.2 06/29/2024   MPG 102.54 06/29/2024   No results found for: PROLACTIN Lab Results  Component Value Date   CHOL 189 06/29/2024   TRIG 86 06/29/2024   HDL 58 06/29/2024   CHOLHDL 3.3 06/29/2024   VLDL 17 06/29/2024   LDLCALC 114 (H) 06/29/2024    Physical Findings: AIMS:  ,  ,  ,  ,  ,  ,   CIWA:    COWS:     Musculoskeletal: Strength & Muscle Tone: within normal limits Gait & Station: normal Patient leans: N/A  Psychiatric Specialty Exam:  Presentation  General Appearance and behavior:  Limited grooming, in bed, moderate rapport.  No EPS.  Eye Contact: Good.  Speech: Spontaneous.  Not pressured or loud.  Mood and Affect  Mood: Subjective anxiety.  Not subjectively anxious.  Affect: Restricted and appropriate.   Thought Process  Thought Processes: Circumstantial  Descriptions of Associations:Intact  Orientation:Full (Time, Place and Person)  Thought Content: No  current suicidal thoughts.  No homicidal thoughts.  No thoughts of violence.  No negative ruminative flooding.  No guilty ruminations.  No delusional theme.  No obsessions.  Hallucinations: No hallucination in any modality.  Sensorium  Memory: Good.  Judgment: Good.  Insight: Limited as she does not want to deal with her addiction.  Executive Functions  Concentration: Good.  Attention Span: Good.  Recall: Good.  Fund of Knowledge: Good.  Language: Good   Psychomotor Activity  Normal psychomotor activity    Physical Exam: Physical Exam ROS Blood pressure (!) 117/45, pulse 80, temperature  97.9 F (36.6 C), temperature source Oral, resp. rate 16, height 5' 7 (1.702 m), weight 68.9 kg, SpO2 100%. Body mass index is 23.81 kg/m.   Treatment Plan Summary: Patient is not exhibiting any major psychopathology.  She is more focused on controlled substances.  She is dismissive of her addiction and does not want to engage with outpatient addiction treatment.  No current dangerousness.  We are finalizing aftercare.  Scheduled for discharge tomorrow.  1.  Escitalopram  5 mg at bedtime.   2.  Quetiapine  50 mg at bedtime. 3.  Continue to encourage unit groups and therapeutic activities. 4.  Continue to monitor mood behavior and interaction with others. 5.  Continue to motivate patient towards addiction treatment. 6.  Social worker will coordinate discharge and aftercare planning.  Jerrell DELENA Forehand, MD 07/02/2024, 9:50 AM
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
## 2024-07-11 ENCOUNTER — Telehealth: Admitting: Family

## 2024-07-11 DIAGNOSIS — K047 Periapical abscess without sinus: Secondary | ICD-10-CM

## 2024-07-11 DIAGNOSIS — K0889 Other specified disorders of teeth and supporting structures: Secondary | ICD-10-CM

## 2024-07-11 MED ORDER — CLINDAMYCIN HCL 300 MG PO CAPS: 300.0000 mg | ORAL_CAPSULE | Freq: Three times a day (TID) | ORAL | 0 refills | Status: AC

## 2024-07-11 MED ORDER — IBUPROFEN 600 MG PO TABS: 600.0000 mg | ORAL_TABLET | Freq: Three times a day (TID) | ORAL | 0 refills | Status: AC | PRN

## 2024-07-11 NOTE — Progress Notes (Signed)
 E-Visit for Dental Pain  We are sorry that you are not feeling well.  Here is how we plan to help!  Based on what you have shared with me in the questionnaire, it sounds like you have dental abscess.  Clindamycin  300mg  3 times a day for 7 days and Ibuprofen  600mg  3 times a day for 7 days for discomfort  It is imperative that you see a dentist within 10 days of this eVisit to determine the cause of the dental pain and be sure it is adequately treated  A toothache or tooth pain is caused when the nerve in the root of a tooth or surrounding a tooth is irritated. Dental (tooth) infection, decay, injury, or loss of a tooth are the most common causes of dental pain. Pain may also occur after an extraction (tooth is pulled out). Pain sometimes originates from other areas and radiates to the jaw, thus appearing to be tooth pain.Bacteria growing inside your mouth can contribute to gum disease and dental decay, both of which can cause pain. A toothache occurs from inflammation of the central portion of the tooth called pulp. The pulp contains nerve endings that are very sensitive to pain. Inflammation to the pulp or pulpitis may be caused by dental cavities, trauma, and infection.    HOME CARE:   For toothaches: Over-the-counter pain medications such as acetaminophen  or ibuprofen  may be used. Take these as directed on the package while you arrange for a dental appointment. Avoid very cold or hot foods, because they may make the pain worse. You may get relief from biting on a cotton ball soaked in oil of cloves. You can get oil of cloves at most drug stores.  For jaw pain:  Aspirin may be helpful for problems in the joint of the jaw in adults. If pain happens every time you open your mouth widely, the temporomandibular joint (TMJ) may be the source of the pain. Yawning or taking a large bite of food may worsen the pain. An appointment with your doctor or dentist will help you find the cause.     GET  HELP RIGHT AWAY IF:  You have a high fever or chills If you have had a recent head or face injury and develop headache, light headedness, nausea, vomiting, or other symptoms that concern you after an injury to your face or mouth, you could have a more serious injury in addition to your dental injury. A facial rash associated with a toothache: This condition may improve with medication. Contact your doctor for them to decide what is appropriate. Any jaw pain occurring with chest pain: Although jaw pain is most commonly caused by dental disease, it is sometimes referred pain from other areas. People with heart disease, especially people who have had stents placed, people with diabetes, or those who have had heart surgery may have jaw pain as a symptom of heart attack or angina. If your jaw or tooth pain is associated with lightheadedness, sweating, or shortness of breath, you should see a doctor as soon as possible. Trouble swallowing or excessive pain or bleeding from gums: If you have a history of a weakened immune system, diabetes, or steroid use, you may be more susceptible to infections. Infections can often be more severe and extensive or caused by unusual organisms. Dental and gum infections in people with these conditions may require more aggressive treatment. An abscess may need draining or IV antibiotics, for example.  MAKE SURE YOU   Understand these instructions.  Will watch your condition. Will get help right away if you are not doing well or get worse.  Thank you for choosing an e-visit.  Your e-visit answers were reviewed by a board certified advanced clinical practitioner to complete your personal care plan. Depending upon the condition, your plan could have included both over the counter or prescription medications.  Please review your pharmacy choice. Make sure the pharmacy is open so you can pick up prescription now. If there is a problem, you may contact your provider through  [employer] and have the prescription routed to another pharmacy.  Your safety is important to us . If you have drug allergies check your prescription carefully.   For the next 24 hours you can use MyChart to ask questions about today's visit, request a non-urgent call back, or ask for a work or school excuse. You will get an email in the next two days asking about your experience. I hope that your e-visit has been valuable and will speed your recovery. Approximately 5 minutes was spent documenting and reviewing patient's chart.

## 2024-07-12 ENCOUNTER — Ambulatory Visit
Admission: RE | Admit: 2024-07-12 | Discharge: 2024-07-12 | Disposition: A | Discharge status: Home / Self Care | Source: Ambulatory Visit

## 2024-07-12 VITALS — BP 134/90 | HR 108 | Temp 98.8°F | Resp 17

## 2024-07-12 DIAGNOSIS — N9489 Other specified conditions associated with female genital organs and menstrual cycle: Secondary | ICD-10-CM | POA: Diagnosis not present

## 2024-07-12 NOTE — Discharge Instructions (Signed)
  1. Labial swelling (Primary) - No profound abnormality noted on exam today in urgent care if symptoms persist or progress follow-up with primary care provider or in the ER for further evaluation and management if symptoms become much worse. - Hernia cannot be ruled out today in urgent care due to lack of advanced imaging and no specific signs or symptoms on physical exam, however this does not mean that hernia is not present and reduced. - Continue to monitor for any change in severity if there is any escalation of current symptoms or development of new symptoms follow-up in ER or with PCP for further evaluation management

## 2024-07-12 NOTE — ED Provider Notes (Addendum)
 [facility]  Note:  This document was prepared using Dragon voice recognition software and may include unintentional dictation errors.  MRN: [id number] DOB: [birth year]  Subjective:   Latoya Schaefer is a 53 y.o. female presenting for abnormal swelling to the right labial area.  Patient reports that she initially noticed the bulge in her right groin area 2 years ago, however over the last few days she has noticed mild tenderness to the area.  Patient denies any drainage, vaginal itching or irritation, burning with urination, discharge, abdominal pain, vaginal pain.  Patient denies any known injury to the area.  Patient states that she can feel it when she palpates the area but currently has no pain.  Patient reports that she noticed the swelling over the last couple of days has been 2-3 times more swollen than the left side, minimal to no swelling noted at this time.  No current facility-administered medications for this encounter.  Current Outpatient Medications:    clindamycin  (CLEOCIN ) 300 MG capsule, Take 1 capsule (300 mg total) by mouth 3 (three) times daily for 7 days., Disp: 21 capsule, Rfl: 0   escitalopram  (LEXAPRO ) 20 MG tablet, Take 1 tablet (20 mg total) by mouth daily., Disp: 30 tablet, Rfl: 0   hydrOXYzine  (ATARAX ) 25 MG tablet, Take 1 tablet (25 mg total) by mouth 3 (three) times daily as needed for anxiety., Disp: 30 tablet, Rfl: 0   ibuprofen  (ADVIL ) 600 MG tablet, Take 1 tablet (600 mg total) by mouth every 8 (eight) hours as needed., Disp: 60 tablet, Rfl: 0   QUEtiapine  (SEROQUEL ) 50 MG tablet, Take 1 tablet (50 mg total) by mouth at bedtime., Disp: 30 tablet, Rfl: 0   No Known Allergies  Past Medical History:  Diagnosis Date   Anxiety    Depression      History reviewed. No pertinent surgical history.  History reviewed. No pertinent family history.  Social History   Tobacco Use   Smoking status: Never   Smokeless tobacco: Never   Substance Use Topics   Alcohol use: No   Drug use: No    ROS Refer to HPI for ROS details.  Objective:    Vitals: BP (!) 134/90 (BP Location: Right Arm)   Pulse (!) 108   Temp 98.8 F (37.1 C) (Oral)   Resp 17   LMP 07/06/2024 (Approximate)   SpO2 96%   Physical Exam Vitals and nursing note reviewed. Exam conducted with a chaperone present.  Constitutional:      General: She is not in acute distress.    Appearance: Normal appearance. She is not ill-appearing.  HENT:     Head: Normocephalic.  Cardiovascular:     Rate and Rhythm: Normal rate.  Pulmonary:     Effort: Pulmonary effort is normal. No respiratory distress.  Abdominal:     Hernia: There is no hernia in the left inguinal area or right inguinal area.  Genitourinary:    General: Normal vulva.     Pubic Area: No rash.      Labia:        Right: No rash, tenderness, lesion or injury.        Left: No rash, tenderness, lesion or injury.      Vagina: No vaginal discharge.  Lymphadenopathy:     Lower Body: No right inguinal adenopathy. No left inguinal adenopathy.  Skin:    General: Skin is warm and dry.     Capillary Refill: Capillary refill takes less than 2 seconds.  Neurological:     General: No focal deficit present.     Mental Status: She is alert and oriented to person, place, and time.  Psychiatric:        Mood and Affect: Mood normal.        Behavior: Behavior normal.     Procedures  No results found for this or any previous visit (from the past 24 hours).  Assessment and Plan :     Discharge Instructions       1. Labial swelling (Primary) - No profound abnormality noted on exam today in urgent care if symptoms persist or progress follow-up with primary care provider or in the ER for further evaluation and management if symptoms become much worse. - Hernia cannot be ruled out today in urgent care due to lack of advanced imaging and no specific signs or symptoms on physical exam, however  this does not mean that hernia is not present and reduced. - Continue to monitor for any change in severity if there is any escalation of current symptoms or development of new symptoms follow-up in ER or with PCP for further evaluation management       Ethel NOVAK Aurea Aurea, Ethel B, NP 07/12/24 1511    Aurea Ethel B, NP 07/12/24 1512

## 2024-07-12 NOTE — ED Triage Notes (Addendum)
 Pt presents due to buldge on right side of groin outside of labia.States she noticed it about 2 years ago initially and forgot about it then yesterday notice again and now has pressure and slight ache. Denies vaginal itching or burning.SABRA Pt changed out of clothing with drape for exam.

## 2024-07-26 DIAGNOSIS — F331 Major depressive disorder, recurrent, moderate: Secondary | ICD-10-CM | POA: Diagnosis not present

## 2024-09-24 DIAGNOSIS — F3181 Bipolar II disorder: Secondary | ICD-10-CM | POA: Diagnosis not present
